# Patient Record
Sex: Female | Born: 1995 | Race: White | Hispanic: No | Marital: Single | Smoking: Never smoker
Health system: Southern US, Community
[De-identification: ages and names within clinical notes are randomized; demographics above are authoritative.]

## PROBLEM LIST (undated history)

## (undated) DIAGNOSIS — N83209 Unspecified ovarian cyst, unspecified side: Secondary | ICD-10-CM

## (undated) DIAGNOSIS — K900 Celiac disease: Secondary | ICD-10-CM

## (undated) DIAGNOSIS — J45909 Unspecified asthma, uncomplicated: Secondary | ICD-10-CM

## (undated) DIAGNOSIS — L309 Dermatitis, unspecified: Secondary | ICD-10-CM

## (undated) HISTORY — DX: Celiac disease: K90.0

## (undated) HISTORY — PX: TONSILLECTOMY: SUR1361

## (undated) HISTORY — PX: ADENOIDECTOMY: SUR15

## (undated) HISTORY — DX: Dermatitis, unspecified: L30.9

## (undated) HISTORY — DX: Unspecified asthma, uncomplicated: J45.909

---

## 2015-05-13 DIAGNOSIS — F419 Anxiety disorder, unspecified: Secondary | ICD-10-CM | POA: Insufficient documentation

## 2015-09-04 DIAGNOSIS — N920 Excessive and frequent menstruation with regular cycle: Secondary | ICD-10-CM | POA: Insufficient documentation

## 2015-09-17 DIAGNOSIS — N83209 Unspecified ovarian cyst, unspecified side: Secondary | ICD-10-CM | POA: Insufficient documentation

## 2016-07-02 DIAGNOSIS — K219 Gastro-esophageal reflux disease without esophagitis: Secondary | ICD-10-CM | POA: Insufficient documentation

## 2016-07-15 ENCOUNTER — Ambulatory Visit (INDEPENDENT_AMBULATORY_CARE_PROVIDER_SITE_OTHER): Payer: Self-pay | Admitting: Orthopaedic Surgery

## 2016-07-20 ENCOUNTER — Encounter (INDEPENDENT_AMBULATORY_CARE_PROVIDER_SITE_OTHER): Payer: Self-pay | Admitting: Orthopaedic Surgery

## 2016-07-20 ENCOUNTER — Ambulatory Visit (INDEPENDENT_AMBULATORY_CARE_PROVIDER_SITE_OTHER): Payer: 59 | Admitting: Orthopaedic Surgery

## 2016-07-20 DIAGNOSIS — M79641 Pain in right hand: Secondary | ICD-10-CM | POA: Diagnosis not present

## 2016-07-20 NOTE — Progress Notes (Signed)
   Office Visit Note   Patient: Jill Vincent           Date of Birth: January 15, 1996           MRN: [id number] Visit Date: 07/20/2016              Requested by: Ilona SorrelAmie Smith Adams, NP [address] [employer] [facility] [place], [place] [id number] PCP: Ilona SorrelADAMS, AMIE SMITH, NP   Assessment & Plan: Visit Diagnoses: No diagnosis found.  Plan: Patient likely has overuse of her interossei muscles with her job. I recommend continue to buddy tape and activity modification. Recommend scheduled meloxicam for 3 weeks and then as needed. If not better should follow up we'll consider advanced imaging.  Follow-Up Instructions: Return if symptoms worsen or fail to improve.   Orders:  No orders of the defined types were placed in this encounter.  No orders of the defined types were placed in this encounter.     Procedures: No procedures performed   Clinical Data: No additional findings.   Subjective: Chief Complaint  Patient presents with  . Right Hand - Pain    Jill Vincent is a 21 yo, RHD, female who presents with a chief complaint of right 4th and 5th digit pain since November. She denies injury. She was seen at Urgent Care in November and XR at that time was negative for fractures. She endorses some numbness and tingling but complains primarily of aching pain. At worst her pain is a 5/10. Patient works as a Pharmacologistpharmacy technician and states that her pain is worse when typing. She has tried meloxicam and voltaren gel with some relief. She took prednisone last week for a URI and did not notice improvement in her hand symptoms. She has also buddy tapes her fingers as a reminder to not use her fingers.     Review of Systems Complete review of systems negative except for history of present illness  Objective: Vital Signs: There were no vitals taken for this visit.  Physical Exam  Constitutional: She is oriented to person, place, and time. She appears well-developed and well-nourished.    HENT:  Head: Normocephalic and atraumatic.  Eyes: EOM are normal.  Neck: Neck supple.  Pulmonary/Chest: Effort normal.  Abdominal: Soft.  Neurological: She is alert and oriented to person, place, and time.  Skin: Skin is warm. Capillary refill takes less than 2 seconds.  Psychiatric: She has a normal mood and affect. Her behavior is normal. Judgment and thought content normal.  Nursing note and vitals reviewed.   Ortho Exam Exam of the right hand shows no swelling or masses or lesions. There is not really tenderness palpation. There is no ulnar nerve symptoms at the elbow or at the wrist. There is no focal motor or sensory deficits. There is no triggering of the fingers. Ulnar side of the wrist is nontender. Specialty Comments:  No specialty comments available.  Imaging: No results found.   PMFS History: There are no active problems to display for this patient.  No past medical history on file.  No family history on file.  No past surgical history on file. Social History   Occupational History  . Not on file.   Social History Main Topics  . Smoking status: Never Smoker  . Smokeless tobacco: Never Used  . Alcohol use Not on file  . Drug use: Unknown  . Sexual activity: Not on file

## 2017-04-28 ENCOUNTER — Ambulatory Visit: Payer: Self-pay | Admitting: Registered"

## 2017-06-24 DIAGNOSIS — K900 Celiac disease: Secondary | ICD-10-CM | POA: Insufficient documentation

## 2017-06-27 HISTORY — PX: UPPER GI ENDOSCOPY: SHX6162

## 2017-08-09 ENCOUNTER — Encounter: Payer: 59 | Attending: Gastroenterology | Admitting: Skilled Nursing Facility1

## 2017-08-09 ENCOUNTER — Encounter: Payer: Self-pay | Admitting: Skilled Nursing Facility1

## 2017-08-09 DIAGNOSIS — K900 Celiac disease: Secondary | ICD-10-CM | POA: Diagnosis not present

## 2017-08-09 DIAGNOSIS — Z713 Dietary counseling and surveillance: Secondary | ICD-10-CM | POA: Diagnosis not present

## 2017-08-09 DIAGNOSIS — Z6835 Body mass index (BMI) 35.0-35.9, adult: Secondary | ICD-10-CM | POA: Diagnosis not present

## 2017-08-09 NOTE — Patient Instructions (Addendum)
-  No Wheat, barley, rye, malt, none at all  -Be Dedra SkeensLeary of: Boullion cubes, brown rice syrup, candy, cold cuts, hot dogs, salami, sausage, communion wafers, Jamaica[place] fries, gravy, imitation fish, licorice, matzo, rice mixes, vegetables in sauce, soy sauce, soups, self-basting Malawiturkey, seasoned snacks, (chips, tortilla chips), sauces

## 2017-08-09 NOTE — Progress Notes (Signed)
  Assessment:  Primary concerns today: referred for celiac disease.  Newly diagnosed as of a month. Pt states she has adhd and if she eats with her medicine it does not affect her appetite. Pt state she is a recovering picky eater. Pt states not eating as much wheat she has less symptoms.   MEDICATIONS: See List    DIETARY INTAKE:  Usual eating pattern includes 2 meals and 2 snacks per day.  Everyday foods include none stated.  Avoided foods include Gluten.    24-hr recall:  B ( AM): skipped Snk ( AM): nuts with dark chocolate chips L ( PM): rice or potato with chicken or beef or pork and salad Snk ( PM): fruit D ( PM): rice or potato with chicken or beef or pork and salad Snk ( PM):  Beverages: water, juice  Usual physical activity: has a gym membership but has not gone  Estimated energy needs: 1600 calories 180 g carbohydrates 120 g protein 44 g fat  Progress Towards Goal(s):  In progress.   Intervention:  Nutrition counseling for celiac diet. Goals: -No Wheat, barley, rye, malt, none at all -Be Dedra SkeensLeary of: Boullion cubes, brown rice syrup, candy, cold cuts, hot dogs, salami, sausage, communion wafers, JamaicaFrench fries, gravy, imitation fish, licorice, matzo, rice mixes, vegetables in sauce, soy sauce, soups, self-basting Malawiturkey, seasoned snacks, (chips, tortilla chips), sauces   Teaching Method Utilized:  Visual Auditory Hands on  Barriers to learning/adherence to lifestyle change: none identified   Demonstrated degree of understanding via:  Teach Back   Monitoring/Evaluation:  Dietary intake, exercise, and body weight prn.

## 2018-01-04 LAB — BASIC METABOLIC PANEL
BUN: 9 (ref 4–21)
CREATININE: 0.9 (ref 0.5–1.1)
GLUCOSE: 72
POTASSIUM: 3.9 (ref 3.4–5.3)
Sodium: 139 (ref 137–147)

## 2018-01-04 LAB — HEPATIC FUNCTION PANEL
ALT: 29 (ref 7–35)
AST: 18 (ref 13–35)

## 2018-01-04 LAB — TSH: TSH: 2.4 (ref 0.41–5.90)

## 2018-01-05 LAB — LIPID PANEL: LDL Cholesterol: 180

## 2018-01-05 LAB — CBC AND DIFFERENTIAL
Hemoglobin: 14 (ref 12.0–16.0)
PLATELETS: 327 (ref 150–399)
WBC: 7

## 2018-01-17 ENCOUNTER — Ambulatory Visit (INDEPENDENT_AMBULATORY_CARE_PROVIDER_SITE_OTHER): Payer: 59 | Admitting: Allergy and Immunology

## 2018-01-17 ENCOUNTER — Encounter: Payer: Self-pay | Admitting: Allergy and Immunology

## 2018-01-17 VITALS — BP 110/80 | HR 116 | Temp 98.3°F | Resp 16 | Ht 64.0 in | Wt 204.8 lb

## 2018-01-17 DIAGNOSIS — K900 Celiac disease: Secondary | ICD-10-CM

## 2018-01-17 DIAGNOSIS — H1010 Acute atopic conjunctivitis, unspecified eye: Secondary | ICD-10-CM

## 2018-01-17 DIAGNOSIS — J3089 Other allergic rhinitis: Secondary | ICD-10-CM

## 2018-01-17 DIAGNOSIS — J4530 Mild persistent asthma, uncomplicated: Secondary | ICD-10-CM

## 2018-01-17 MED ORDER — RANITIDINE HCL 300 MG PO TABS: 300.0000 mg | ORAL_TABLET | Freq: Every day | ORAL | 2 refills | Status: DC

## 2018-01-17 NOTE — Patient Instructions (Addendum)
  1.  Allergen avoidance measures  2.  Consider a lactose-free diet for 4 weeks  3.  Consider a dairy free diet for 4 weeks  4.  Change omeprazole to ranitidine 300 mg 1 time per day for 4 weeks  5.  Continue to treat and prevent inflammation:   A.  Qvar 40 Redihaler -2 inhalations 1-2 times per day  B.  Montelukast 10 mg -1 tablet 1 time per day  6.  If needed:   A.  OTC antihistamine -Claritin/Zyrtec/Allegra  B.  Patanol 1 drop each eye twice a day  C.  Ventolin HFA 2 inhalations every 4-6 hours  D.  OTC moisturizer  7.  Obtain full flu vaccine every year  8.  Return to clinic December 2019 or earlier if problem

## 2018-01-17 NOTE — Progress Notes (Signed)
Dear Laverda Sorenson,  Thank you for referring Jill Vincent to the [facility] on 01/17/2018.   Below is a summation of this patient's evaluation and recommendations.  Thank you for your referral. I will keep you informed about this patient's response to treatment.   If you have any questions please do not hesitate to contact me.   Sincerely,  Jessica Priest, MD Allergy / Immunology [facility]   ______________________________________________________________________    NEW PATIENT NOTE  Referring Provider: Ilona Sorrel, NP Primary Provider: Ilona Sorrel, NP Date of office visit: 01/17/2018    Subjective:   Chief Complaint:  Jill Vincent (DOB: Jan 14, 1996) is a 22 y.o. female who presents to the clinic on 01/17/2018 with a chief complaint of New Patient (Initial Visit) (celiac disease dx Jan 2019 ) .     HPI: Jill Vincent presents to this clinic in evaluation of allergic disease.  She appears to have 4 issues.  First, she was diagnosed with celiac disease January 2019 and with a gluten-free diet most of her diarrhea and a fair amount of her bloating has resolved.  However, she still has intermittent bloating and nausea whenever she eats a meal.  She is interested in investigating whether or not there is a food allergy contributing to this issue.  It should be noted that she also started omeprazole around the point in time in which she was diagnosed with celiac disease and also started a vitamin B supplement around the point in time in which she was diagnosed with celiac disease.  She has tried a rice free diet for 4 weeks and a potato free diet for 4 weeks which did not help her.  She has decreased her dairy consumption 50% but this has not helped her.  Second, she has a history of asthma that presented itself in 2017.  Apparently the trigger for this issue appeared to be secondhand  smoke exposure from a neighbor next-door.  She was started on an inhaled steroid and a leukotriene modifier and she has excellent control at this point in time.  Rarely does she use a short acting bronchodilator and she can exercise without any problem.  She does occasionally get some issues with intermittent shortness of breath for which she will rarely use a short acting bronchodilator during the pollination season of the year.  She only uses Qvar spring through fall but does consistently use a leukotriene modifier through the year.  Third, she has a history of nasal congestion and sneezing and some occasional itchy eyes once again occurring on a perennial basis with flare during the spring and fall for which she will rarely use any Flonase and she does believe that the consistent use of a leukotriene modifier helps this issue.  Fourth, she has a history of eczema that only shows itself at points in time during the wintertime.  She will utilize Eucerin and very rarely will utilize a triamcinolone cream for about 2 weeks during the winter.  This eczema predominantly involves her thighs and arms.  Past Medical History:  Diagnosis Date  . Asthma   . Celiac disease   . Eczema     Past Surgical History:  Procedure Laterality Date  . ADENOIDECTOMY    . TONSILLECTOMY      Allergies as of 01/17/2018      Reactions   Wheat Bran       Medication  List      ALPRAZolam 0.25 MG tablet Commonly known as:  XANAX Take 0.25 mg by mouth daily as needed.   amphetamine-dextroamphetamine 10 MG tablet Commonly known as:  ADDERALL Take 10 mg by mouth.   beclomethasone 40 MCG/ACT inhaler Commonly known as:  QVAR Inhale into the lungs 2 (two) times daily.   busPIRone 15 MG tablet Commonly known as:  BUSPAR TAKE 1/2 TABLET TWICE A DAY FOR 90 DAYS   DULoxetine 60 MG capsule Commonly known as:  CYMBALTA Take by mouth daily.   DULoxetine 30 MG capsule Commonly known as:  CYMBALTA Take by mouth  daily.   LO LOESTRIN FE 1 MG-10 MCG / 10 MCG tablet Generic drug:  Norethindrone-Ethinyl Estradiol-Fe Biphas Take 1 tablet by mouth daily.   meloxicam 15 MG tablet Commonly known as:  MOBIC Take 15 mg by mouth daily.   methylphenidate 36 MG CR tablet Commonly known as:  CONCERTA TAKE 1 TABLET BY MOUTH EVERY DAY IN THE MORNING   montelukast 10 MG tablet Commonly known as:  SINGULAIR TAKE 1 TABLET ONCE A DAY/SEASONALLY   olopatadine 0.1 % ophthalmic solution Commonly known as:  PATANOL INSTILL 1 DROP INTO THE AFFECTED EYE(S) TWICE A DAY AS NEEDED   omeprazole 40 MG capsule Commonly known as:  PRILOSEC Take by mouth daily.   VENTOLIN HFA 108 (90 Base) MCG/ACT inhaler Generic drug:  albuterol USE 2 PUFFS EVERY 4 HOURS AS NEEDED FOR WHEEZING OR SHORTNESS OF BREATH       Review of systems negative except as noted in HPI / PMHx or noted below:  Review of Systems  Constitutional: Negative.   HENT: Negative.   Eyes: Negative.   Respiratory: Negative.   Cardiovascular: Negative.   Gastrointestinal: Negative.   Genitourinary: Negative.   Musculoskeletal: Negative.   Skin: Negative.   Neurological: Negative.   Endo/Heme/Allergies: Negative.   Psychiatric/Behavioral: Negative.     Family History  Problem Relation Age of Onset  . Asthma Other   . Cancer Other   . Diabetes Other   . Hypertension Other   . Stroke Other   . Heart disease Other   . Allergic rhinitis Other   . Eczema Sister   . Urticaria Neg Hx     Social History   Socioeconomic History  . Marital status: Single    Spouse name: Not on file  . Number of children: Not on file  . Years of education: Not on file  . Highest education level: Not on file  Occupational History  . Not on file  Social Needs  . Financial resource strain: Not on file  . Food insecurity:    Worry: Not on file    Inability: Not on file  . Transportation needs:    Medical: Not on file    Non-medical: Not on file    Tobacco Use  . Smoking status: Never Smoker  . Smokeless tobacco: Never Used  Substance and Sexual Activity  . Alcohol use: Not on file  . Drug use: Not on file  . Sexual activity: Not on file  Lifestyle  . Physical activity:    Days per week: Not on file    Minutes per session: Not on file  . Stress: Not on file  Relationships  . Social connections:    Talks on phone: Not on file    Gets together: Not on file    Attends religious service: Not on file    Active member of club or  organization: Not on file    Attends meetings of clubs or organizations: Not on file    Relationship status: Not on file  . Intimate partner violence:    Fear of current or ex partner: Not on file    Emotionally abused: Not on file    Physically abused: Not on file    Forced sexual activity: Not on file  Other Topics Concern  . Not on file  Social History Narrative  . Not on file    Environmental and Social history  Lives in a apartment with a dry environment, a cat and dog located inside the household, carpet in the bedroom, plastic on the bed, no plastic on the pillow, no smokers located inside the household.  She is a Pharmacologist.  Objective:   Vitals:   01/17/18 0810  BP: 110/80  Pulse: (!) 116  Resp: 16  Temp: 98.3 F (36.8 C)   Height: 5\' 4"  (162.6 cm) Weight: 204 lb 12.8 oz (92.9 kg)  Physical Exam  HENT:  Head: Normocephalic. Head is without right periorbital erythema and without left periorbital erythema.  Right Ear: Tympanic membrane, external ear and ear canal normal.  Left Ear: Tympanic membrane, external ear and ear canal normal.  Nose: Nose normal. No mucosal edema or rhinorrhea.  Mouth/Throat: Oropharynx is clear and moist and mucous membranes are normal. No oropharyngeal exudate.  Eyes: Pupils are equal, round, and reactive to light. Conjunctivae and lids are normal.  Neck: Trachea normal. No tracheal deviation present. No thyromegaly present.  Cardiovascular:  Normal rate, regular rhythm, S1 normal, S2 normal and normal heart sounds.  No murmur heard. Pulmonary/Chest: Effort normal. No stridor. No respiratory distress. She has no wheezes. She has no rales. She exhibits no tenderness.  Abdominal: Soft. She exhibits no distension and no mass. There is no hepatosplenomegaly. There is no tenderness. There is no rebound and no guarding.  Musculoskeletal: She exhibits no edema or tenderness.  Lymphadenopathy:       Head (right side): No tonsillar adenopathy present.       Head (left side): No tonsillar adenopathy present.    She has no cervical adenopathy.    She has no axillary adenopathy.  Neurological: She is alert.  Skin: No rash noted. She is not diaphoretic. No erythema. No pallor. Nails show no clubbing.    Diagnostics: Allergy skin tests were performed.  She demonstrated hypersensitivity to grass, trees, weeds, house dust mite, cat.  She did not demonstrate any hypersensitivity against a screening panel of foods.  Spirometry was performed and demonstrated an FEV1 of 3.05 @ 93 % of predicted. FEV1/FVC = 0.92  Assessment and Plan:    1. Asthma, well controlled, mild persistent   2. Perennial allergic rhinitis   3. Seasonal allergic conjunctivitis   4. Adult form of celiac disease     1.  Allergen avoidance measures  2.  Consider a lactose-free diet for 4 weeks  3.  Consider a dairy free diet for 4 weeks  4.  Change omeprazole to ranitidine 300 mg 1 time per day for 4 weeks  5.  Continue to treat and prevent inflammation:   A.  Qvar 40 Redihaler -2 inhalations 1-2 times per day  B.  Montelukast 10 mg -1 tablet 1 time per day  6.  If needed:   A.  OTC antihistamine -Claritin/Zyrtec/Allegra  B.  Patanol 1 drop each eye twice a day  C.  Ventolin HFA 2 inhalations every 4-6 hours  D.  OTC moisturizer  7.  Obtain full flu vaccine every year  8.  Return to clinic December 2019 or earlier if problem  Jill Vincent certainly has atopic  disease and most of this disease state appears to involve her respiratory track and her conjunctiva and occasionally her skin.  I am not sure that atopic disease is responsible for her GI issue.  I did make some suggestions about manipulation she can attempt including using a lactose-free diet, using a dairy free diet, changing her omeprazole to ranitidine to see if this helps her GI issue.  She will keep in contact with me noting her response to the approach noted above.  I will see her back in this clinic in December 2019 or earlier if there is a problem.  Jessica PriestEric J. Toan Mort, MD Allergy / Immunology [facility] [place]

## 2018-01-18 ENCOUNTER — Encounter: Payer: Self-pay | Admitting: Allergy and Immunology

## 2018-03-07 ENCOUNTER — Ambulatory Visit (HOSPITAL_COMMUNITY)
Admission: EM | Admit: 2018-03-07 | Discharge: 2018-03-07 | Disposition: A | Discharge status: Home / Self Care | Payer: 59 | Attending: Family Medicine | Admitting: Family Medicine

## 2018-03-07 ENCOUNTER — Encounter (HOSPITAL_COMMUNITY): Payer: Self-pay

## 2018-03-07 ENCOUNTER — Other Ambulatory Visit: Payer: Self-pay

## 2018-03-07 DIAGNOSIS — G43109 Migraine with aura, not intractable, without status migrainosus: Secondary | ICD-10-CM | POA: Diagnosis not present

## 2018-03-07 MED ORDER — KETOROLAC TROMETHAMINE 60 MG/2ML IM SOLN
60.0000 mg | Freq: Once | INTRAMUSCULAR | Status: AC
  Administered 2018-03-07: 60 mg via INTRAMUSCULAR

## 2018-03-07 MED ORDER — KETOROLAC TROMETHAMINE 60 MG/2ML IM SOLN
INTRAMUSCULAR | Status: AC
  Filled 2018-03-07: qty 2

## 2018-03-07 MED ORDER — ONDANSETRON 4 MG PO TBDP
4.0000 mg | ORAL_TABLET | Freq: Once | ORAL | Status: AC
  Administered 2018-03-07: 4 mg via ORAL

## 2018-03-07 MED ORDER — ONDANSETRON 4 MG PO TBDP
ORAL_TABLET | ORAL | Status: AC
  Filled 2018-03-07: qty 1

## 2018-03-07 MED ORDER — ONDANSETRON HCL 8 MG PO TABS: 8.0000 mg | ORAL_TABLET | Freq: Three times a day (TID) | ORAL | 3 refills | Status: DC | PRN

## 2018-03-07 NOTE — ED Provider Notes (Signed)
[facility]    CSN: [id number] Arrival date & time: 03/07/18  [id number]     History   Chief Complaint Chief Complaint  Patient presents with  . Migraine    HPI Jenell MillinerMargaret Walkup is a 22 y.o. female.   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  The onset of this was 4 AM this morning when patient woke up with left retro-orbital pain.  She is taken to rizatriptan tablets so far and has seen no improvement.  Headache is associated with photophobia and nausea but no vomiting.  Significant negatives: No fever, head trauma, dizziness, ear pain, difficulty swallowing or moving  Past medical history: Patient has had migraine headaches for over 10 years.  Over the past year she has been having worsening headaches, now 3-4 times a month.  Generally if she takes the rizatriptan early, the headache will be aborted.  Nevertheless she is missing at least one day a month from the headaches.  These are usually accompanied by nausea, and less frequently, photophobia.  Patient has not tried Topamax or other preventive medicines.  Patient has a appointment with her primary care doctor in October.     Past Medical History:  Diagnosis Date  . Asthma   . Celiac disease   . Eczema     There are no active problems to display for this patient.   Past Surgical History:  Procedure Laterality Date  . ADENOIDECTOMY    . TONSILLECTOMY      OB History   None      Home Medications    Prior to Admission medications   Medication Sig Start Date End Date Taking? Authorizing Provider  ALPRAZolam (XANAX) 0.25 MG tablet Take 0.25 mg by mouth daily as needed. 11/10/17   [provider]  amphetamine-dextroamphetamine (ADDERALL) 10 MG tablet Take 10 mg by mouth. 05/11/16   [provider]  beclomethasone (QVAR) 40 MCG/ACT inhaler Inhale into the lungs 2 (two) times daily.    [provider]  busPIRone (BUSPAR) 15 MG tablet TAKE 1/2 TABLET TWICE A DAY  FOR 90 DAYS 01/04/18   [provider]  DULoxetine (CYMBALTA) 30 MG capsule Take by mouth daily. 12/07/17   [provider]  DULoxetine (CYMBALTA) 60 MG capsule Take by mouth daily. 11/04/17   [provider]  LO LOESTRIN FE 1 MG-10 MCG / 10 MCG tablet Take 1 tablet by mouth daily. 01/07/18   [provider]  meloxicam (MOBIC) 15 MG tablet Take 15 mg by mouth daily. 06/12/16   [provider]  methylphenidate 36 MG PO CR tablet TAKE 1 TABLET BY MOUTH EVERY DAY IN THE MORNING 10/12/17   [provider]  montelukast (SINGULAIR) 10 MG tablet TAKE 1 TABLET ONCE A DAY/SEASONALLY 12/12/17   [provider]  olopatadine (PATANOL) 0.1 % ophthalmic solution INSTILL 1 DROP INTO THE AFFECTED EYE(S) TWICE A DAY AS NEEDED 01/02/18   [provider]  omeprazole (PRILOSEC) 40 MG capsule Take by mouth daily. 10/18/17   [provider]  ondansetron (ZOFRAN) 8 MG tablet Take 1 tablet (8 mg total) by mouth every 8 (eight) hours as needed for nausea or vomiting. 03/07/18   Elvina SidleLauenstein, Azahel Belcastro, MD  ranitidine (ZANTAC) 300 MG tablet Take 1 tablet (300 mg total) by mouth at bedtime. 01/17/18   Kozlow, Alvira PhilipsEric J, MD  VENTOLIN HFA 108 (90 Base) MCG/ACT inhaler USE 2 PUFFS EVERY 4 HOURS AS NEEDED FOR WHEEZING OR SHORTNESS OF BREATH 07/12/16  [provider]    Family History Family History  Problem Relation Age of Onset  . Asthma Other   . Cancer Other   . Diabetes Other   . Hypertension Other   . Stroke Other   . Heart disease Other   . Allergic rhinitis Other   . Eczema Sister   . Urticaria Neg Hx     Social History Social History   Tobacco Use  . Smoking status: Never Smoker  . Smokeless tobacco: Never Used  Substance Use Topics  . Alcohol use: Not on file  . Drug use: Not on file     Allergies   Wheat bran   Review of Systems Review of Systems  Eyes: Positive for photophobia. Negative for visual disturbance.    Gastrointestinal: Positive for nausea. Negative for vomiting.  Neurological: Positive for headaches.  All other systems reviewed and are negative.    Physical Exam Triage Vital Signs ED Triage Vitals  Enc Vitals Group     BP 03/07/18 0941 128/87     Pulse Rate 03/07/18 0941 (!) 118     Resp 03/07/18 0941 16     Temp 03/07/18 0941 98.7 F (37.1 C)     Temp Source 03/07/18 0941 Oral     SpO2 03/07/18 0941 100 %     Weight 03/07/18 0944 200 lb (90.7 kg)     Height --      Head Circumference --      Peak Flow --      Pain Score --      Pain Loc --      Pain Edu? --      Excl. in GC? --    No data found.  Updated Vital Signs BP 128/87 (BP Location: Right Arm)   Pulse (!) 118 Comment: Pt sts that this is normal for her due to meds that she is taking for ADD  Temp 98.7 F (37.1 C) (Oral)   Resp 16   Wt 90.7 kg   SpO2 100%   BMI 34.33 kg/m   Visual Acuity Right Eye Distance:   Left Eye Distance:   Bilateral Distance:    Right Eye Near:   Left Eye Near:    Bilateral Near:     Physical Exam  Constitutional: She is oriented to person, place, and time. She appears well-developed and well-nourished.  HENT:  Right Ear: External ear normal.  Left Ear: External ear normal.  Mouth/Throat: Oropharynx is clear and moist.  Eyes: Pupils are equal, round, and reactive to light. Conjunctivae and EOM are normal.  Fundi are normal  Neck: Normal range of motion. Neck supple.  Cardiovascular: Normal rate, regular rhythm and normal heart sounds.  Pulmonary/Chest: Effort normal and breath sounds normal.  Musculoskeletal: Normal range of motion.  Neurological: She is alert and oriented to person, place, and time. No cranial nerve deficit. She exhibits normal muscle tone. Coordination normal.  Skin: Skin is warm and dry.  Psychiatric: She has a normal mood and affect. Her behavior is normal. Judgment and thought content normal.  Nursing note and vitals reviewed.    UC Treatments  / Results  Labs (all labs ordered are listed, but only abnormal results are displayed) Labs Reviewed - No data to display  EKG None  Radiology No results found.  Procedures Procedures (including critical care time)  Medications Ordered in UC Medications  ondansetron (ZOFRAN-ODT) disintegrating tablet 4 mg (has no administration in time range)  ketorolac (TORADOL) injection 60  mg (has no administration in time range)    Initial Impression / Assessment and Plan / UC Course  I have reviewed the triage vital signs and the nursing notes.  Pertinent labs & imaging results that were available during my care of the patient were reviewed by me and considered in my medical decision making (see chart for details).    Final Clinical Impressions(s) / UC Diagnoses   Final diagnoses:  Migraine with aura and without status migrainosus, not intractable     Discharge Instructions     Discuss preventive medicines with your primary care doctor at your next visit: Topamax and Aimovig    ED Prescriptions    Medication Sig Dispense Auth. Provider   ondansetron (ZOFRAN) 8 MG tablet Take 1 tablet (8 mg total) by mouth every 8 (eight) hours as needed for nausea or vomiting. 20 tablet Elvina Sidle, MD     Controlled Substance Prescriptions [place] Controlled Substance Registry consulted? Not Applicable   Elvina Sidle, MD 03/07/18 1019

## 2018-03-07 NOTE — ED Triage Notes (Signed)
Pt states she has a migraine and she has taken all of her med's. The mirgraine is getting worst. This started today.

## 2018-03-07 NOTE — Discharge Instructions (Addendum)
Discuss preventive medicines with your primary care doctor at your next visit: Topamax and Aimovig

## 2018-05-01 ENCOUNTER — Other Ambulatory Visit: Payer: Self-pay | Admitting: *Deleted

## 2018-05-01 ENCOUNTER — Telehealth: Payer: Self-pay | Admitting: Allergy and Immunology

## 2018-05-01 MED ORDER — MONTELUKAST SODIUM 10 MG PO TABS: ORAL_TABLET | ORAL | 5 refills | Status: DC

## 2018-05-01 NOTE — Telephone Encounter (Signed)
Pt called and needs to have singulair called into [facility] [address]. [phone]

## 2018-05-01 NOTE — Telephone Encounter (Signed)
Prescription has been sent in. Called patient and informed them of prescription being sent in. Patient verbalized understanding.

## 2018-05-16 ENCOUNTER — Ambulatory Visit (INDEPENDENT_AMBULATORY_CARE_PROVIDER_SITE_OTHER): Payer: 59 | Admitting: Allergy and Immunology

## 2018-05-16 ENCOUNTER — Encounter: Payer: Self-pay | Admitting: Allergy and Immunology

## 2018-05-16 VITALS — BP 126/88 | HR 96 | Resp 20

## 2018-05-16 DIAGNOSIS — J3089 Other allergic rhinitis: Secondary | ICD-10-CM | POA: Diagnosis not present

## 2018-05-16 DIAGNOSIS — H1010 Acute atopic conjunctivitis, unspecified eye: Secondary | ICD-10-CM

## 2018-05-16 DIAGNOSIS — J4530 Mild persistent asthma, uncomplicated: Secondary | ICD-10-CM | POA: Diagnosis not present

## 2018-05-16 NOTE — Progress Notes (Signed)
Follow-up Note  Referring Provider: Ilona Vincent, Jill Smith, NP Primary Provider: Sigmund Vincent, Lisa, MD Date of Office Visit: 05/16/2018  Subjective:   Jill Vincent (DOB: [birth year]) is a 22 y.o. female who [facility] in re-evaluation of the following:  HPI: Jill Vincent returns to this clinic in reevaluation of her atopic disease.  I last saw her in his clinic during initial evaluation of 17 January 2018.  Concerning her asthma and allergic rhinitis this is actually under very good control with the use of montelukast as her sole controller agent at this point of the year.  She was able to taper off her Qvar after the pollen season abated at the beginning of November.  She rarely uses a short acting bronchodilator and can exert herself without any problem.  She did have a gastrointestinal issue when she came to see me which is being followed by her gastroenterologist and she is now using Nexium to treat reflux and overall is doing better as long as she remains away from grain consumption other than corn and rice.  She did obtain the flu vaccine.  Allergies as of 05/16/2018      Reactions   Wheat Bran       Medication List      ALPRAZolam 0.25 MG tablet Commonly known as:  XANAX Take 0.25 mg by mouth daily as needed.   amphetamine-dextroamphetamine 10 MG tablet Commonly known as:  ADDERALL Take 10 mg by mouth.   beclomethasone 40 MCG/ACT inhaler Commonly known as:  QVAR Inhale into the lungs 2 (two) times daily.   busPIRone 15 MG tablet Commonly known as:  BUSPAR TAKE 1/2 TABLET TWICE A DAY FOR 90 DAYS   DULoxetine 60 MG capsule Commonly known as:  CYMBALTA Take by mouth daily.   DULoxetine 30 MG capsule Commonly known as:  CYMBALTA Take by mouth daily.   LO LOESTRIN FE 1 MG-10 MCG / 10 MCG tablet Generic drug:  Norethindrone-Ethinyl Estradiol-Fe Biphas Take 1 tablet by mouth daily.   meloxicam 15 MG tablet Commonly known as:   MOBIC Take 15 mg by mouth daily.   methylphenidate 36 MG CR tablet Commonly known as:  CONCERTA TAKE 1 TABLET BY MOUTH EVERY DAY IN THE MORNING   montelukast 10 MG tablet Commonly known as:  SINGULAIR TAKE 1 TABLET ONCE A DAY/SEASONALLY   olopatadine 0.1 % ophthalmic solution Commonly known as:  PATANOL INSTILL 1 DROP INTO THE AFFECTED EYE(S) TWICE A DAY AS NEEDED   omeprazole 40 MG capsule Commonly known as:  PRILOSEC Take by mouth daily.   ondansetron 8 MG tablet Commonly known as:  ZOFRAN Take 1 tablet (8 mg total) by mouth every 8 (eight) hours as needed for nausea or vomiting.   ranitidine 300 MG tablet Commonly known as:  ZANTAC Take 1 tablet (300 mg total) by mouth at bedtime.   VENTOLIN HFA 108 (90 Base) MCG/ACT inhaler Generic drug:  albuterol USE 2 PUFFS EVERY 4 HOURS AS NEEDED FOR WHEEZING OR SHORTNESS OF BREATH       Past Medical History:  Diagnosis Date  . Asthma   . Celiac disease   . Eczema     Past Surgical History:  Procedure Laterality Date  . ADENOIDECTOMY    . TONSILLECTOMY      Review of systems negative except as noted in HPI / PMHx or noted below:  Review of Systems  Constitutional: Negative.   HENT: Negative.   Eyes:  Negative.   Respiratory: Negative.   Cardiovascular: Negative.   Gastrointestinal: Negative.   Genitourinary: Negative.   Musculoskeletal: Negative.   Skin: Negative.   Neurological: Negative.   Endo/Heme/Allergies: Negative.   Psychiatric/Behavioral: Negative.      Objective:   Vitals:   05/16/18 1142  BP: 126/88  Pulse: 96  Resp: 20          Physical Exam  HENT:  Head: Normocephalic.  Right Ear: Tympanic membrane, external ear and ear canal normal.  Left Ear: Tympanic membrane, external ear and ear canal normal.  Nose: Nose normal. No mucosal edema or rhinorrhea.  Mouth/Throat: Uvula is midline, oropharynx is clear and moist and mucous membranes are normal. No oropharyngeal exudate.  Eyes:  Conjunctivae are normal.  Neck: Trachea normal. No tracheal tenderness present. No tracheal deviation present. No thyromegaly present.  Cardiovascular: Normal rate, regular rhythm, S1 normal, S2 normal and normal heart sounds.  No murmur heard. Pulmonary/Chest: Breath sounds normal. No stridor. No respiratory distress. She has no wheezes. She has no rales.  Musculoskeletal: She exhibits no edema.  Lymphadenopathy:       Head (right side): No tonsillar adenopathy present.       Head (left side): No tonsillar adenopathy present.    She has no cervical adenopathy.  Neurological: She is alert.  Skin: No rash noted. She is not diaphoretic. No erythema. Nails show no clubbing.    Diagnostics:    Spirometry was performed and demonstrated an FEV1 of 3.01 at 79 % of predicted.  The patient had an Asthma Control Test with the following results: ACT Total Score: 21.    Assessment and Plan:   1. Asthma, well controlled, mild persistent   2. Perennial allergic rhinitis   3. Seasonal allergic conjunctivitis     1.  Allergen avoidance measures  2. Continue to treat and prevent inflammation:   A.  Qvar 40 Redihaler -2 inhalations 1-2 times per day  B.  Montelukast 10 mg -1 tablet 1 time per day  3.  If needed:   A.  OTC antihistamine -Claritin/Zyrtec/Allegra  B.  Patanol 1 drop each eye twice a day  C.  Ventolin HFA 2 inhalations every 4-6 hours  D.  OTC moisturizer  4.  "Action plan" for asthma flareup:   A.  Increase Qvar to 3 inhalations 3 times a day  B.  Use Ventolin HFA if needed  5.  Return to clinic Summer 2020 or earlier if problem  Overall Jill Vincent has shown improvement with her current therapy and I think it would be worthwhile for her to remain on montelukast on a consistent basis.  Currently she is not using her Qvar but I have encouraged her to restart her Qvar Valentine's Day 2020 in anticipation of springtime pollen exposure.  I will see her back in this clinic in the  summer 2020 or earlier if there is a problem.  Jill Schimke, MD Allergy / Immunology [facility]

## 2018-05-16 NOTE — Patient Instructions (Addendum)
  1.  Allergen avoidance measures  2. Continue to treat and prevent inflammation:   A.  Qvar 40 Redihaler -2 inhalations 1-2 times per day  B.  Montelukast 10 mg -1 tablet 1 time per day  3.  If needed:   A.  OTC antihistamine -Claritin/Zyrtec/Allegra  B.  Patanol 1 drop each eye twice a day  C.  Ventolin HFA 2 inhalations every 4-6 hours  D.  OTC moisturizer  4.  "Action plan" for asthma flareup:   A.  Increase Qvar to 3 inhalations 3 times a day  B.  Use Ventolin HFA if needed  5.  Return to clinic Summer 2020 or earlier if problem

## 2018-05-17 ENCOUNTER — Encounter: Payer: Self-pay | Admitting: Allergy and Immunology

## 2018-05-18 ENCOUNTER — Ambulatory Visit (INDEPENDENT_AMBULATORY_CARE_PROVIDER_SITE_OTHER): Payer: 59 | Admitting: Family Medicine

## 2018-05-18 ENCOUNTER — Encounter: Payer: Self-pay | Admitting: Family Medicine

## 2018-05-18 ENCOUNTER — Encounter: Payer: Self-pay | Admitting: Emergency Medicine

## 2018-05-18 VITALS — BP 102/70 | HR 121 | Temp 97.8°F | Wt 203.8 lb

## 2018-05-18 DIAGNOSIS — F419 Anxiety disorder, unspecified: Secondary | ICD-10-CM

## 2018-05-18 DIAGNOSIS — J4540 Moderate persistent asthma, uncomplicated: Secondary | ICD-10-CM | POA: Diagnosis not present

## 2018-05-18 DIAGNOSIS — F900 Attention-deficit hyperactivity disorder, predominantly inattentive type: Secondary | ICD-10-CM | POA: Diagnosis not present

## 2018-05-18 MED ORDER — METHYLPHENIDATE HCL ER (OSM) 36 MG PO TBCR: EXTENDED_RELEASE_TABLET | ORAL | 0 refills | Status: DC

## 2018-05-18 MED ORDER — BUSPIRONE HCL 15 MG PO TABS: ORAL_TABLET | ORAL | 2 refills | Status: DC

## 2018-05-18 NOTE — Patient Instructions (Signed)
Great to meet you! Continue current medications, Buspar sent in to replace alprazolam.  See you again in 3 months.

## 2018-05-18 NOTE — Assessment & Plan Note (Signed)
-  Stable with current medications continue.

## 2018-05-18 NOTE — Assessment & Plan Note (Signed)
-  Doing well with cymbalta, continue -Restart buspar in place of alprazolam -Referral for counseling.

## 2018-05-18 NOTE — Assessment & Plan Note (Signed)
-  Doing well with current medications -Will continue concerta at current dose. Still has adderall at home as she only uses when she has evening class -Controlled medication agreement signed

## 2018-05-18 NOTE — Progress Notes (Signed)
Jill Vincent - 22 y.o. female MRN [id number]  Date of birth: 1996/03/21  Subjective Chief Complaint  Patient presents with  . Medication Refill    HPI Jill Vincent is a 22 y.o. female with history of Asthma, celiac disease, anxiety and ADHD here today to establish care with new pcp and refill of medications.   -ADHD:  History of ADHD diagnosed 5-6 years ago.  Current treatment with Concerta 36mg  daily with 10mg  adderall IR in the afternoon/evening as needed on days she has classes.  She reports that adderall makes her feels a little jittery/anxious but she only uses 1-2 nights per week.  She denies side effects from Concerta including increased anxiety, palpitations, or insomnia.  Weights have been stable.  Previous records reviewed as available through care everywhere.  [place] PMP reviewed and shows consistent fill of medication.   -Anxiety:  Current treatment with cymbalta 90mg  daily.  She has been prescribed alprazolam as well as needed.  Typically only takes this when going back home to [place] as this is stressful for her.  She prefers buspar over alprazolam but this had been on backorder.  She would like to get back on the buspar at this point.    -Asthma:  Has bene fairly well controlled with current inhalers and singulair.  Denies increased wheezing, cough or shortness of breath.   ROS:  A comprehensive ROS was completed and negative except as noted per HPI  Allergies  Allergen Reactions  . Wheat Bran     Past Medical History:  Diagnosis Date  . Asthma   . Celiac disease   . Eczema     Past Surgical History:  Procedure Laterality Date  . ADENOIDECTOMY    . TONSILLECTOMY      Social History   Socioeconomic History  . Marital status: Single    Spouse name: Not on file  . Number of children: Not on file  . Years of education: Not on file  . Highest education level: Not on file  Occupational History  . Not on file  Social Needs  . Financial resource strain: Not  on file  . Food insecurity:    Worry: Not on file    Inability: Not on file  . Transportation needs:    Medical: Not on file    Non-medical: Not on file  Tobacco Use  . Smoking status: Never Smoker  . Smokeless tobacco: Never Used  Substance and Sexual Activity  . Alcohol use: Yes    Comment: social   . Drug use: Never  . Sexual activity: Not on file  Lifestyle  . Physical activity:    Days per week: Not on file    Minutes per session: Not on file  . Stress: Not on file  Relationships  . Social connections:    Talks on phone: Not on file    Gets together: Not on file    Attends religious service: Not on file    Active member of club or organization: Not on file    Attends meetings of clubs or organizations: Not on file    Relationship status: Not on file  Other Topics Concern  . Not on file  Social History Narrative  . Not on file    Family History  Problem Relation Age of Onset  . Allergic rhinitis Other   . Eczema Sister   . Hyperthyroidism Sister   . Graves' disease Sister   . Hypertension Father   . Diabetes Paternal Uncle   .  Hypertension Maternal Grandmother   . Cancer Maternal Grandmother   . Kidney cancer Maternal Grandmother   . Breast cancer Maternal Grandmother   . Heart disease Maternal Grandfather   . Stroke Maternal Grandfather   . Cancer Maternal Grandfather   . Lung cancer Maternal Grandfather   . Cancer Paternal Grandmother   . Lung cancer Paternal Grandmother   . Cancer Paternal Grandfather   . Lung cancer Paternal Grandfather   . Urticaria Neg Hx     Health Maintenance  Topic Date Due  . TETANUS/TDAP  12/09/2012  . PAP SMEAR  12/10/2014  . HIV Screening  05/19/2019 (Originally 12/09/2008)  . INFLUENZA VACCINE  Completed     ----------------------------------------------------------------------------------------------------------------------------------------------------------------------------------------------------------------- Physical Exam BP 102/70   Pulse (!) 121   Temp 97.8 F (36.6 C) (Oral)   Wt 203 lb 12.8 oz (92.4 kg)   SpO2 98%   BMI 34.98 kg/m   Physical Exam  Constitutional: She is oriented to person, place, and time. She appears well-nourished. No distress.  HENT:  Head: Normocephalic and atraumatic.  Mouth/Throat: Oropharynx is clear and moist.  Eyes: No scleral icterus.  Neck: Neck supple. No thyromegaly present.  Cardiovascular: Normal rate, regular rhythm and normal heart sounds.  Pulmonary/Chest: Effort normal and breath sounds normal.  Lymphadenopathy:    She has no cervical adenopathy.  Neurological: She is alert and oriented to person, place, and time.  Skin: Skin is warm and dry.  Psychiatric: She has a normal mood and affect. Her behavior is normal.    ------------------------------------------------------------------------------------------------------------------------------------------------------------------------------------------------------------------- Assessment and Plan  Moderate persistent asthma without complication -Stable with current medications continue.   Attention deficit hyperactivity disorder (ADHD), predominantly inattentive type -Doing well with current medications -Will continue concerta at current dose. Still has adderall at home as she only uses when she has evening class -Controlled medication agreement signed   Anxiety -Doing well with cymbalta, continue -Restart buspar in place of alprazolam -Referral for counseling.

## 2018-05-31 ENCOUNTER — Encounter: Payer: Self-pay | Admitting: Family Medicine

## 2018-06-15 ENCOUNTER — Ambulatory Visit (INDEPENDENT_AMBULATORY_CARE_PROVIDER_SITE_OTHER): Payer: 59

## 2018-06-15 ENCOUNTER — Ambulatory Visit (INDEPENDENT_AMBULATORY_CARE_PROVIDER_SITE_OTHER): Payer: 59 | Admitting: Family Medicine

## 2018-06-15 ENCOUNTER — Encounter: Payer: Self-pay | Admitting: Family Medicine

## 2018-06-15 VITALS — BP 132/68 | HR 98 | Temp 98.5°F | Ht 64.0 in | Wt 206.0 lb

## 2018-06-15 DIAGNOSIS — R109 Unspecified abdominal pain: Secondary | ICD-10-CM | POA: Diagnosis not present

## 2018-06-15 DIAGNOSIS — R300 Dysuria: Secondary | ICD-10-CM

## 2018-06-15 LAB — POC URINALSYSI DIPSTICK (AUTOMATED)
Bilirubin, UA: NEGATIVE
Blood, UA: NEGATIVE
Glucose, UA: NEGATIVE
Ketones, UA: NEGATIVE
Leukocytes, UA: NEGATIVE
Nitrite, UA: NEGATIVE
PROTEIN UA: NEGATIVE
SPEC GRAV UA: 1.01 (ref 1.010–1.025)
Urobilinogen, UA: 0.2 E.U./dL
pH, UA: 7.5 (ref 5.0–8.0)

## 2018-06-15 MED ORDER — METHYLPHENIDATE HCL ER (OSM) 36 MG PO TBCR: EXTENDED_RELEASE_TABLET | ORAL | 0 refills | Status: DC

## 2018-06-15 MED ORDER — HYDROCODONE-ACETAMINOPHEN 5-325 MG PO TABS: 1.0000 | ORAL_TABLET | Freq: Four times a day (QID) | ORAL | 0 refills | Status: DC | PRN

## 2018-06-15 NOTE — Assessment & Plan Note (Signed)
-  Differential includes stone vs infection vs muscle strain/spasm.  I think infection such as pyelonephritis is less likely with normal UA.  -KUB ordered, may need to [place] or CT scan if inconclusive -Check CBC -UA sent for culture.  -Norco 5/325mg  #15 for pain control.

## 2018-06-15 NOTE — Progress Notes (Signed)
Jill MillinerMargaret Hornaday - 23 y.o. female MRN [id number]  Date of birth: [birth year]  Subjective Chief Complaint  Patient presents with  . Dysuria    ongoing for one day-increased pain and urination    HPI Jill MillinerMargaret Vincent is a 23 y.o. female here today with complaint of L flank pain.  She reports that symptoms began about 2 days ago.  She does feel like she has had some pain with urination on occasion as well as mild nausea.  She may have had  Low grade fever.  She denies blood in her urine, foul odor, vaginal discharge, abdominal pain, shortness of breath.  She has some left over norco at home and used 1/2 tab of 5/325mg  with some relief but is still pretty uncomfortable.  She has no history of kidney stones.   ROS:  A comprehensive ROS was completed and negative except as noted per HPI  Allergies  Allergen Reactions  . Wheat Bran     Past Medical History:  Diagnosis Date  . Asthma   . Celiac disease   . Eczema     Past Surgical History:  Procedure Laterality Date  . ADENOIDECTOMY    . TONSILLECTOMY    . UPPER GI ENDOSCOPY  06/27/2017    Social History   Socioeconomic History  . Marital status: Single    Spouse name: Not on file  . Number of children: Not on file  . Years of education: Not on file  . Highest education level: Not on file  Occupational History  . Not on file  Social Needs  . Financial resource strain: Not on file  . Food insecurity:    Worry: Not on file    Inability: Not on file  . Transportation needs:    Medical: Not on file    Non-medical: Not on file  Tobacco Use  . Smoking status: Never Smoker  . Smokeless tobacco: Never Used  Substance and Sexual Activity  . Alcohol use: Yes    Comment: social   . Drug use: Never  . Sexual activity: Not on file  Lifestyle  . Physical activity:    Days per week: Not on file    Minutes per session: Not on file  . Stress: Not on file  Relationships  . Social connections:    Talks on phone: Not on file   Gets together: Not on file    Attends religious service: Not on file    Active member of club or organization: Not on file    Attends meetings of clubs or organizations: Not on file    Relationship status: Not on file  Other Topics Concern  . Not on file  Social History Narrative  . Not on file    Family History  Problem Relation Age of Onset  . Allergic rhinitis Other   . Eczema Sister   . Hyperthyroidism Sister   . Graves' disease Sister   . Hypertension Father   . Diabetes Paternal Uncle   . Hypertension Maternal Grandmother   . Cancer Maternal Grandmother   . Kidney cancer Maternal Grandmother   . Breast cancer Maternal Grandmother   . Heart disease Maternal Grandfather   . Stroke Maternal Grandfather   . Cancer Maternal Grandfather   . Lung cancer Maternal Grandfather   . Cancer Paternal Grandmother   . Lung cancer Paternal Grandmother   . Cancer Paternal Grandfather   . Lung cancer Paternal Grandfather   . Urticaria Neg Hx     Health  Maintenance  Topic Date Due  . PAP SMEAR-Modifier  12/10/2014  . HIV Screening  05/19/2019 (Originally 12/09/2008)  . PAP-Cervical Cytology Screening  01/04/2021  . TETANUS/TDAP  02/09/2027  . INFLUENZA VACCINE  Completed    ----------------------------------------------------------------------------------------------------------------------------------------------------------------------------------------------------------------- Physical Exam BP 132/68   Pulse 98   Temp 98.5 F (36.9 C) (Oral)   Ht 5\' 4"  (1.626 m)   Wt 206 lb (93.4 kg)   SpO2 100%   BMI 35.36 kg/m   Physical Exam HENT:     Head: Normocephalic and atraumatic.     Mouth/Throat:     Mouth: Mucous membranes are moist.  Eyes:     General: No scleral icterus. Cardiovascular:     Rate and Rhythm: Normal rate and regular rhythm.  Pulmonary:     Effort: Pulmonary effort is normal.     Breath sounds: Normal breath sounds.  Abdominal:     General: There is  no distension.     Palpations: Abdomen is soft.     Tenderness: There is no abdominal tenderness. There is left CVA tenderness. There is no right CVA tenderness.  Skin:    General: Skin is warm and dry.  Neurological:     General: No focal deficit present.     Mental Status: She is alert.  Psychiatric:        Mood and Affect: Mood normal.        Behavior: Behavior normal.     ------------------------------------------------------------------------------------------------------------------------------------------------------------------------------------------------------------------- Assessment and Plan  No problem-specific Assessment & Plan notes found for this encounter.

## 2018-06-15 NOTE — Patient Instructions (Addendum)
I'll be in touch with lab and xray results.  Use norco as directed for pain control.

## 2018-06-15 NOTE — Addendum Note (Signed)
Addended by: Mammie Lorenzo on: 06/15/2018 04:32 PM   Modules accepted: Orders

## 2018-06-16 LAB — CBC
HCT: 42.5 % (ref 36.0–46.0)
HEMOGLOBIN: 14.1 g/dL (ref 12.0–15.0)
MCHC: 33.2 g/dL (ref 30.0–36.0)
MCV: 89.3 fl (ref 78.0–100.0)
PLATELETS: 294 10*3/uL (ref 150.0–400.0)
RBC: 4.76 Mil/uL (ref 3.87–5.11)
RDW: 13.1 % (ref 11.5–15.5)
WBC: 4.8 10*3/uL (ref 4.0–10.5)

## 2018-06-16 LAB — URINE CULTURE
MICRO NUMBER:: 3042
RESULT: NO GROWTH
SPECIMEN QUALITY:: ADEQUATE

## 2018-06-19 ENCOUNTER — Encounter: Payer: Self-pay | Admitting: Family Medicine

## 2018-06-19 ENCOUNTER — Inpatient Hospital Stay: Admission: RE | Admit: 2018-06-19 | Payer: Self-pay | Source: Ambulatory Visit

## 2018-07-05 ENCOUNTER — Ambulatory Visit (INDEPENDENT_AMBULATORY_CARE_PROVIDER_SITE_OTHER): Payer: 59 | Admitting: Psychology

## 2018-07-05 DIAGNOSIS — F411 Generalized anxiety disorder: Secondary | ICD-10-CM | POA: Diagnosis not present

## 2018-07-05 DIAGNOSIS — F331 Major depressive disorder, recurrent, moderate: Secondary | ICD-10-CM | POA: Diagnosis not present

## 2018-07-16 ENCOUNTER — Other Ambulatory Visit: Payer: Self-pay | Admitting: Family Medicine

## 2018-07-26 ENCOUNTER — Ambulatory Visit (INDEPENDENT_AMBULATORY_CARE_PROVIDER_SITE_OTHER): Payer: 59 | Admitting: Psychology

## 2018-07-26 DIAGNOSIS — F331 Major depressive disorder, recurrent, moderate: Secondary | ICD-10-CM

## 2018-07-26 DIAGNOSIS — F411 Generalized anxiety disorder: Secondary | ICD-10-CM | POA: Diagnosis not present

## 2018-07-28 ENCOUNTER — Encounter: Payer: Self-pay | Admitting: Family Medicine

## 2018-08-02 ENCOUNTER — Ambulatory Visit: Payer: 59 | Admitting: Family Medicine

## 2018-08-03 ENCOUNTER — Ambulatory Visit (INDEPENDENT_AMBULATORY_CARE_PROVIDER_SITE_OTHER): Payer: 59 | Admitting: Family Medicine

## 2018-08-03 ENCOUNTER — Encounter: Payer: Self-pay | Admitting: Family Medicine

## 2018-08-03 VITALS — BP 130/68 | HR 88 | Temp 97.9°F | Ht 64.0 in | Wt 204.0 lb

## 2018-08-03 DIAGNOSIS — Z79899 Other long term (current) drug therapy: Secondary | ICD-10-CM

## 2018-08-03 DIAGNOSIS — F900 Attention-deficit hyperactivity disorder, predominantly inattentive type: Secondary | ICD-10-CM | POA: Diagnosis not present

## 2018-08-03 MED ORDER — METHYLPHENIDATE HCL ER (OSM) 36 MG PO TBCR: EXTENDED_RELEASE_TABLET | ORAL | 0 refills | Status: DC

## 2018-08-03 NOTE — Progress Notes (Signed)
Jill Vincent - 23 y.o. female MRN [id number]  Date of birth: Jan 01, 1996  Subjective Chief Complaint  Patient presents with  . Follow-up    denies health changes    HPI Jill Vincent is a 23 y.o. female here today for follow up of ADHD.  Currently taking concerta 36mg  daily.  This continues to work well for her.  She does also have adderall 10mg  but only uses this if needing to study in the evening for school.  She does not need a refill of this today.  She denies side effects from medications including insomnia, chest pain, palpitations or shortness of breath.   ROS:  A comprehensive ROS was completed and negative except as noted per HPI  Allergies  Allergen Reactions  . Wheat Bran     Past Medical History:  Diagnosis Date  . Asthma   . Celiac disease   . Eczema     Past Surgical History:  Procedure Laterality Date  . ADENOIDECTOMY    . TONSILLECTOMY    . UPPER GI ENDOSCOPY  06/27/2017    Social History   Socioeconomic History  . Marital status: Single    Spouse name: Not on file  . Number of children: Not on file  . Years of education: Not on file  . Highest education level: Not on file  Occupational History  . Not on file  Social Needs  . Financial resource strain: Not on file  . Food insecurity:    Worry: Not on file    Inability: Not on file  . Transportation needs:    Medical: Not on file    Non-medical: Not on file  Tobacco Use  . Smoking status: Never Smoker  . Smokeless tobacco: Never Used  Substance and Sexual Activity  . Alcohol use: Yes    Comment: social   . Drug use: Never  . Sexual activity: Not on file  Lifestyle  . Physical activity:    Days per week: Not on file    Minutes per session: Not on file  . Stress: Not on file  Relationships  . Social connections:    Talks on phone: Not on file    Gets together: Not on file    Attends religious service: Not on file    Active member of club or organization: Not on file    Attends  meetings of clubs or organizations: Not on file    Relationship status: Not on file  Other Topics Concern  . Not on file  Social History Narrative  . Not on file    Family History  Problem Relation Age of Onset  . Allergic rhinitis Other   . Eczema Sister   . Hyperthyroidism Sister   . Graves' disease Sister   . Hypertension Father   . Diabetes Paternal Uncle   . Hypertension Maternal Grandmother   . Cancer Maternal Grandmother   . Kidney cancer Maternal Grandmother   . Breast cancer Maternal Grandmother   . Heart disease Maternal Grandfather   . Stroke Maternal Grandfather   . Cancer Maternal Grandfather   . Lung cancer Maternal Grandfather   . Cancer Paternal Grandmother   . Lung cancer Paternal Grandmother   . Cancer Paternal Grandfather   . Lung cancer Paternal Grandfather   . Urticaria Neg Hx     Health Maintenance  Topic Date Due  . PAP SMEAR-Modifier  12/10/2014  . HIV Screening  05/19/2019 (Originally 12/09/2008)  . PAP-Cervical Cytology Screening  01/04/2021  .  TETANUS/TDAP  02/09/2027  . INFLUENZA VACCINE  Completed    ----------------------------------------------------------------------------------------------------------------------------------------------------------------------------------------------------------------- Physical Exam BP 130/68   Pulse 88   Temp 97.9 F (36.6 C) (Oral)   Ht 5\' 4"  (1.626 m)   Wt 204 lb (92.5 kg)   SpO2 98%   BMI 35.02 kg/m   Physical Exam Constitutional:      Appearance: Normal appearance.  HENT:     Head: Normocephalic and atraumatic.     Mouth/Throat:     Mouth: Mucous membranes are moist.  Eyes:     General: No scleral icterus. Neck:     Musculoskeletal: Neck supple.  Cardiovascular:     Rate and Rhythm: Normal rate and regular rhythm.     Heart sounds: Normal heart sounds.  Pulmonary:     Effort: Pulmonary effort is normal.     Breath sounds: Normal breath sounds.  Skin:    General: Skin is warm  and dry.     Findings: No rash.  Neurological:     General: No focal deficit present.     Mental Status: She is alert and oriented to person, place, and time.  Psychiatric:        Mood and Affect: Mood normal.        Behavior: Behavior normal.     ------------------------------------------------------------------------------------------------------------------------------------------------------------------------------------------------------------------- Assessment and Plan  Attention deficit hyperactivity disorder (ADHD), predominantly inattentive type -Stable, doing well with current dose of concerta.   -Concerta renewed -PDMP reviewed and UDS ordered today.

## 2018-08-03 NOTE — Assessment & Plan Note (Signed)
-  Stable, doing well with current dose of concerta.   -Concerta renewed -PDMP reviewed and UDS ordered today.

## 2018-08-06 ENCOUNTER — Other Ambulatory Visit: Payer: Self-pay | Admitting: Family Medicine

## 2018-08-09 ENCOUNTER — Ambulatory Visit: Payer: 59 | Admitting: Family Medicine

## 2018-08-09 ENCOUNTER — Ambulatory Visit (INDEPENDENT_AMBULATORY_CARE_PROVIDER_SITE_OTHER): Payer: 59 | Admitting: Psychology

## 2018-08-09 DIAGNOSIS — F411 Generalized anxiety disorder: Secondary | ICD-10-CM

## 2018-08-09 DIAGNOSIS — F331 Major depressive disorder, recurrent, moderate: Secondary | ICD-10-CM | POA: Diagnosis not present

## 2018-08-09 LAB — TOXASSURE SELECT 13 (MW), URINE

## 2018-08-17 ENCOUNTER — Ambulatory Visit: Payer: 59 | Admitting: Family Medicine

## 2018-08-23 ENCOUNTER — Ambulatory Visit (INDEPENDENT_AMBULATORY_CARE_PROVIDER_SITE_OTHER): Payer: 59 | Admitting: Psychology

## 2018-08-23 DIAGNOSIS — F331 Major depressive disorder, recurrent, moderate: Secondary | ICD-10-CM

## 2018-08-23 DIAGNOSIS — F411 Generalized anxiety disorder: Secondary | ICD-10-CM | POA: Diagnosis not present

## 2018-09-06 ENCOUNTER — Ambulatory Visit (INDEPENDENT_AMBULATORY_CARE_PROVIDER_SITE_OTHER): Payer: 59 | Admitting: Psychology

## 2018-09-06 DIAGNOSIS — F411 Generalized anxiety disorder: Secondary | ICD-10-CM | POA: Diagnosis not present

## 2018-09-06 DIAGNOSIS — F331 Major depressive disorder, recurrent, moderate: Secondary | ICD-10-CM | POA: Diagnosis not present

## 2018-09-11 ENCOUNTER — Other Ambulatory Visit: Payer: Self-pay | Admitting: Family Medicine

## 2018-09-11 DIAGNOSIS — F900 Attention-deficit hyperactivity disorder, predominantly inattentive type: Secondary | ICD-10-CM

## 2018-09-11 MED ORDER — METHYLPHENIDATE HCL ER (OSM) 36 MG PO TBCR: EXTENDED_RELEASE_TABLET | ORAL | 0 refills | Status: DC

## 2018-09-11 NOTE — Telephone Encounter (Signed)
Copied from CRM [id number]. Topic: Quick Communication - Rx Refill/Question >> Sep 11, 2018  1:05 PM Amado Coe, RN wrote: Medication: Methylphenidate 36mg    Has the patient contacted their pharmacy? No. (Agent: If no, request that the patient contact the pharmacy for the refill.) (Agent: If yes, when and what did the pharmacy advise?)  Preferred Pharmacy (with phone number or street name): [facility] [address]  Agent: Please be advised that RX refills may take up to 3 business days. We ask that you follow-up with your pharmacy.

## 2018-09-11 NOTE — Telephone Encounter (Signed)
Rx refill requested . Sent to Dr. Ashley Royalty to [facility].

## 2018-09-20 ENCOUNTER — Ambulatory Visit (INDEPENDENT_AMBULATORY_CARE_PROVIDER_SITE_OTHER): Payer: 59 | Admitting: Psychology

## 2018-09-20 DIAGNOSIS — F331 Major depressive disorder, recurrent, moderate: Secondary | ICD-10-CM

## 2018-09-20 DIAGNOSIS — F411 Generalized anxiety disorder: Secondary | ICD-10-CM | POA: Diagnosis not present

## 2018-10-04 ENCOUNTER — Ambulatory Visit: Payer: 59 | Admitting: Psychology

## 2018-10-18 ENCOUNTER — Ambulatory Visit: Payer: 59 | Admitting: Psychology

## 2018-10-25 ENCOUNTER — Other Ambulatory Visit: Payer: Self-pay | Admitting: Allergy and Immunology

## 2018-11-01 ENCOUNTER — Encounter: Payer: Self-pay | Admitting: Family Medicine

## 2018-11-01 ENCOUNTER — Ambulatory Visit (INDEPENDENT_AMBULATORY_CARE_PROVIDER_SITE_OTHER): Payer: 59 | Admitting: Family Medicine

## 2018-11-01 ENCOUNTER — Ambulatory Visit: Payer: 59 | Admitting: Psychology

## 2018-11-01 DIAGNOSIS — F419 Anxiety disorder, unspecified: Secondary | ICD-10-CM

## 2018-11-01 DIAGNOSIS — F900 Attention-deficit hyperactivity disorder, predominantly inattentive type: Secondary | ICD-10-CM

## 2018-11-01 MED ORDER — METHYLPHENIDATE HCL ER (OSM) 36 MG PO TBCR: EXTENDED_RELEASE_TABLET | ORAL | 0 refills | Status: DC

## 2018-11-01 NOTE — Assessment & Plan Note (Signed)
-  Stable at this time, continue current medications.

## 2018-11-01 NOTE — Assessment & Plan Note (Signed)
-  Symptoms are well controlled with current medication, will continue.  -PDMP reviewed -F/u in 3 months.

## 2018-11-01 NOTE — Progress Notes (Signed)
Jill MillinerMargaret Vincent - 23 y.o. female MRN [id number]  Date of birth: 08/30/1995   This visit type was conducted due to national recommendations for restrictions regarding the COVID-19 Pandemic (e.g. social distancing).  This format is felt to be most appropriate for this patient at this time.  All issues noted in this document were discussed and addressed.  No physical exam was performed (except for noted visual exam findings with Video Visits).  I discussed the limitations of evaluation and management by telemedicine and the availability of in person appointments. The patient expressed understanding and agreed to proceed.  I connected with@ on 11/01/18 at  2:00 PM EDT by a video enabled telemedicine application and verified that I am speaking with the correct person using two identifiers.   Patient Location: Home [address] [place] [id number]   [facility]  Chief Complaint  Patient presents with  . ADHD    3 Mo F/U , Rx refill     HPI  Jill MillinerMargaret Vincent is a 23 y.o. female who presents via audio/video conferencing for a telehealth visit today.  She is following up today for ADHD.  She reports good control of symptoms with current medications.  Rarely using adderall in the afternoon, mainly just concerta in the morning.  She has been working from home mostly during COVID pandemic.  She denies side effects from medication including palpitations, headache, shortness of breath, insomnia or worsening anxiety.     ROS:  A comprehensive ROS was completed and negative except as noted per HPI  Past Medical History:  Diagnosis Date  . Asthma   . Celiac disease   . Eczema     Past Surgical History:  Procedure Laterality Date  . ADENOIDECTOMY    . TONSILLECTOMY    . UPPER GI ENDOSCOPY  06/27/2017    Family History  Problem Relation Age of Onset  . Allergic rhinitis Other   . Eczema Sister   . Hyperthyroidism Sister   . Graves' disease Sister    . Hypertension Father   . Diabetes Paternal Uncle   . Hypertension Maternal Grandmother   . Cancer Maternal Grandmother   . Kidney cancer Maternal Grandmother   . Breast cancer Maternal Grandmother   . Heart disease Maternal Grandfather   . Stroke Maternal Grandfather   . Cancer Maternal Grandfather   . Lung cancer Maternal Grandfather   . Cancer Paternal Grandmother   . Lung cancer Paternal Grandmother   . Cancer Paternal Grandfather   . Lung cancer Paternal Grandfather   . Urticaria Neg Hx     Social History   Socioeconomic History  . Marital status: Single    Spouse name: Not on file  . Number of children: Not on file  . Years of education: Not on file  . Highest education level: Not on file  Occupational History  . Not on file  Social Needs  . Financial resource strain: Not on file  . Food insecurity:    Worry: Not on file    Inability: Not on file  . Transportation needs:    Medical: Not on file    Non-medical: Not on file  Tobacco Use  . Smoking status: Never Smoker  . Smokeless tobacco: Never Used  Substance and Sexual Activity  . Alcohol use: Yes    Comment: social   . Drug use: Never  . Sexual activity: Not on file  Lifestyle  . Physical activity:    Days  per week: Not on file    Minutes per session: Not on file  . Stress: Not on file  Relationships  . Social connections:    Talks on phone: Not on file    Gets together: Not on file    Attends religious service: Not on file    Active member of club or organization: Not on file    Attends meetings of clubs or organizations: Not on file    Relationship status: Not on file  . Intimate partner violence:    Fear of current or ex partner: Not on file    Emotionally abused: Not on file    Physically abused: Not on file    Forced sexual activity: Not on file  Other Topics Concern  . Not on file  Social History Narrative  . Not on file     Current Outpatient Medications:  .  ALPRAZolam (XANAX) 0.25  MG tablet, Take 0.25 mg by mouth daily as needed., Disp: , Rfl: 0 .  amphetamine-dextroamphetamine (ADDERALL) 10 MG tablet, Take 10 mg by mouth., Disp: , Rfl:  .  beclomethasone (QVAR) 40 MCG/ACT inhaler, Inhale into the lungs 2 (two) times daily., Disp: , Rfl:  .  busPIRone (BUSPAR) 15 MG tablet, TAKE 1/2 TABLET TWICE A DAY, Disp: 90 tablet, Rfl: 2 .  DULoxetine (CYMBALTA) 30 MG capsule, Take by mouth 3 (three) times daily. , Disp: , Rfl: 5 .  ipratropium-albuterol (DUONEB) 0.5-2.5 (3) MG/3ML SOLN, Inhale into the lungs., Disp: , Rfl:  .  methylphenidate 36 MG PO CR tablet, TAKE 1 TABLET BY MOUTH EVERY DAY IN THE MORNING, Disp: 30 tablet, Rfl: 0 .  montelukast (SINGULAIR) 10 MG tablet, TAKE 1 TABLET ONCE A DAY/SEASONALLY, Disp: 90 tablet, Rfl: 1 .  Norethindrone-Ethinyl Estradiol-Fe Biphas (LO LOESTRIN FE) 1 MG-10 MCG / 10 MCG tablet, TAKE 1 TABLET BY MOUTH EVERY DAY, Disp: , Rfl:  .  olopatadine (PATANOL) 0.1 % ophthalmic solution, INSTILL 1 DROP INTO THE AFFECTED EYE(S) TWICE A DAY AS NEEDED, Disp: , Rfl: 0 .  omeprazole (PRILOSEC) 10 MG capsule, Take 10 mg by mouth daily., Disp: , Rfl:  .  VENTOLIN HFA 108 (90 Base) MCG/ACT inhaler, USE 2 PUFFS AS NEEDED EVERY 6 HRS, Disp: 18 Inhaler, Rfl: 1  EXAM:  VITALS per patient if applicable: Temp (!) 95.6 F (35.3 C) (Oral)   Ht 5\' 4"  (1.626 m)   Wt 204 lb (92.5 kg)   BMI 35.02 kg/m   GENERAL: alert, oriented, appears well and in no acute distress  HEENT: atraumatic, conjunttiva clear, no obvious abnormalities on inspection of external nose and ears  NECK: normal movements of the head and neck  LUNGS: on inspection no signs of respiratory distress, breathing rate appears normal, no obvious gross SOB, gasping or wheezing  CV: no obvious cyanosis  MS: moves all visible extremities without noticeable abnormality  PSYCH/NEURO: pleasant and cooperative, no obvious depression or anxiety, speech and thought processing grossly intact   ASSESSMENT AND PLAN:  Discussed the following assessment and plan:  Attention deficit hyperactivity disorder (ADHD), predominantly inattentive type -Symptoms are well controlled with current medication, will continue.  -PDMP reviewed -F/u in 3 months.   Anxiety -Stable at this time, continue current medications.        I discussed the assessment and treatment plan with the patient. The patient was provided an opportunity to ask questions and all were answered. The patient agreed with the plan and demonstrated an understanding of the instructions.  The patient was advised to call back or seek an in-person evaluation if the symptoms worsen or if the condition fails to improve as anticipated.     Luetta Nutting, DO

## 2018-11-15 ENCOUNTER — Ambulatory Visit: Payer: 59 | Admitting: Psychology

## 2018-11-15 ENCOUNTER — Other Ambulatory Visit: Payer: Self-pay | Admitting: Family Medicine

## 2018-11-21 ENCOUNTER — Ambulatory Visit: Payer: 59 | Admitting: Allergy and Immunology

## 2018-12-18 ENCOUNTER — Other Ambulatory Visit: Payer: Self-pay | Admitting: Family Medicine

## 2018-12-18 ENCOUNTER — Encounter: Payer: Self-pay | Admitting: Family Medicine

## 2018-12-18 DIAGNOSIS — F900 Attention-deficit hyperactivity disorder, predominantly inattentive type: Secondary | ICD-10-CM

## 2018-12-18 MED ORDER — METHYLPHENIDATE HCL ER (OSM) 36 MG PO TBCR: EXTENDED_RELEASE_TABLET | ORAL | 0 refills | Status: DC

## 2018-12-18 NOTE — Telephone Encounter (Signed)
Please advise. MyCHart message was sent to schedule appt for VV for migraine maintanace medication options.

## 2018-12-19 ENCOUNTER — Ambulatory Visit (INDEPENDENT_AMBULATORY_CARE_PROVIDER_SITE_OTHER): Payer: 59 | Admitting: Family Medicine

## 2018-12-19 ENCOUNTER — Encounter: Payer: Self-pay | Admitting: Family Medicine

## 2018-12-19 ENCOUNTER — Other Ambulatory Visit: Payer: Self-pay

## 2018-12-19 VITALS — BP 114/76 | HR 110 | Temp 98.1°F | Resp 18 | Ht 64.0 in | Wt 214.0 lb

## 2018-12-19 DIAGNOSIS — G43011 Migraine without aura, intractable, with status migrainosus: Secondary | ICD-10-CM | POA: Diagnosis not present

## 2018-12-19 DIAGNOSIS — G43909 Migraine, unspecified, not intractable, without status migrainosus: Secondary | ICD-10-CM | POA: Insufficient documentation

## 2018-12-19 MED ORDER — KETOROLAC TROMETHAMINE 60 MG/2ML IM SOLN
60.0000 mg | Freq: Once | INTRAMUSCULAR | Status: AC
  Administered 2018-12-19: 60 mg via INTRAMUSCULAR

## 2018-12-19 MED ORDER — TOPIRAMATE 50 MG PO TABS: ORAL_TABLET | ORAL | 3 refills | Status: DC

## 2018-12-19 MED ORDER — ONDANSETRON HCL 4 MG/2ML IJ SOLN
4.0000 mg | Freq: Once | INTRAMUSCULAR | Status: AC
  Administered 2018-12-19: 4 mg via INTRAMUSCULAR

## 2018-12-19 MED ORDER — DEXAMETHASONE SODIUM PHOSPHATE 10 MG/ML IJ SOLN
5.0000 mg | Freq: Once | INTRAMUSCULAR | Status: AC
  Administered 2018-12-19: 5 mg via INTRAMUSCULAR

## 2018-12-19 NOTE — Progress Notes (Signed)
Jill Vincent - 23 y.o. female MRN [id number]  Date of birth: Feb 23, 1996  Subjective Chief Complaint  Patient presents with  . Headache    active migraine today, onset 7 days, discuss medication options    HPI Jill Vincent is a 23 y.o. female here today with complaint of migraine.  She reports that current migraine started about 1 week ago.  Started off mild but built into severe headache with nausea and light sensitivity over the past few days.  This is the typical pattern for her migraines. She reports that she has mild-moderate headache about 3x/week which are typically responsive to tylenol/nsaid or maxalt.  She is interested in trying something for prevention of her migraine.  She would also like something for treatment of her acute migraine.  She has already tried ONE[employer] x2 without relief.  She denies vision changes, weakness, numbness, tingling or changes to speech.   ROS:  A comprehensive ROS was completed and negative except as noted per HPI  Allergies  Allergen Reactions  . Wheat Bran     Past Medical History:  Diagnosis Date  . Asthma   . Celiac disease   . Eczema     Past Surgical History:  Procedure Laterality Date  . ADENOIDECTOMY    . TONSILLECTOMY    . UPPER GI ENDOSCOPY  06/27/2017    Social History   Socioeconomic History  . Marital status: Single    Spouse name: Not on file  . Number of children: Not on file  . Years of education: Not on file  . Highest education level: Not on file  Occupational History  . Not on file  Social Needs  . Financial resource strain: Not on file  . Food insecurity    Worry: Not on file    Inability: Not on file  . Transportation needs    Medical: Not on file    Non-medical: Not on file  Tobacco Use  . Smoking status: Never Smoker  . Smokeless tobacco: Never Used  Substance and Sexual Activity  . Alcohol use: Yes    Comment: social   . Drug use: Never  . Sexual activity: Not on file  Lifestyle  . Physical  activity    Days per week: Not on file    Minutes per session: Not on file  . Stress: Not on file  Relationships  . Social Herbalist on phone: Not on file    Gets together: Not on file    Attends religious service: Not on file    Active member of club or organization: Not on file    Attends meetings of clubs or organizations: Not on file    Relationship status: Not on file  Other Topics Concern  . Not on file  Social History Narrative  . Not on file    Family History  Problem Relation Age of Onset  . Allergic rhinitis Other   . Eczema Sister   . Hyperthyroidism Sister   . Graves' disease Sister   . Hypertension Father   . Diabetes Paternal Uncle   . Hypertension Maternal Grandmother   . Cancer Maternal Grandmother   . Kidney cancer Maternal Grandmother   . Breast cancer Maternal Grandmother   . Heart disease Maternal Grandfather   . Stroke Maternal Grandfather   . Cancer Maternal Grandfather   . Lung cancer Maternal Grandfather   . Cancer Paternal Grandmother   . Lung cancer Paternal Grandmother   . Cancer Paternal Grandfather   .  Lung cancer Paternal Grandfather   . Urticaria Neg Hx     Health Maintenance  Topic Date Due  . PAP SMEAR-Modifier  12/10/2014  . HIV Screening  05/19/2019 (Originally 12/09/2008)  . INFLUENZA VACCINE  01/13/2019  . PAP-Cervical Cytology Screening  01/04/2021  . TETANUS/TDAP  02/09/2027    ----------------------------------------------------------------------------------------------------------------------------------------------------------------------------------------------------------------- Physical Exam BP 114/76   Pulse (!) 110   Temp 98.1 F (36.7 C) (Oral)   Resp 18   Ht 5\' 4"  (1.626 m)   Wt 214 lb (97.1 kg)   SpO2 97%   BMI 36.73 kg/m   Physical Exam Constitutional:      Appearance: She is well-developed.     Comments: Sitting in darkened room.   HENT:     Head: Normocephalic and atraumatic.      Mouth/Throat:     Mouth: Mucous membranes are moist.  Eyes:     Extraocular Movements: Extraocular movements intact.     Right eye: Normal extraocular motion and no nystagmus.     Left eye: Normal extraocular motion and no nystagmus.     Pupils: Pupils are equal, round, and reactive to light.  Neck:     Musculoskeletal: Normal range of motion.  Cardiovascular:     Rate and Rhythm: Normal rate and regular rhythm.  Pulmonary:     Effort: Pulmonary effort is normal.     Breath sounds: Normal breath sounds.  Skin:    General: Skin is warm and dry.     Findings: No rash.  Neurological:     Mental Status: She is alert.     Cranial Nerves: No cranial nerve deficit, dysarthria or facial asymmetry.     Sensory: No sensory deficit.     Coordination: Romberg sign negative.     Gait: Gait normal.  Psychiatric:        Mood and Affect: Mood normal. Mood is not anxious.     ------------------------------------------------------------------------------------------------------------------------------------------------------------------------------------------------------------------- Assessment and Plan  Migraine -Given injections of toradol, zofran and dexamethasone today.  -Instructed to rest in darkened room and stay well hydrated.  -Discussed prophylactic medications including propranolol, topiramate and tricyclics.  I think topiramate would be best option with lowest side effect profile and low interaction with other medications.  She is agreeable to this and will titrate up over the next two weeks.  -I will see her back in 6-8 weeks.

## 2018-12-19 NOTE — Assessment & Plan Note (Signed)
-  Given injections of toradol, zofran and dexamethasone today.  -Instructed to rest in darkened room and stay well hydrated.  -Discussed prophylactic medications including propranolol, topiramate and tricyclics.  I think topiramate would be best option with lowest side effect profile and low interaction with other medications.  She is agreeable to this and will titrate up over the next two weeks.  -I will see her back in 6-8 weeks.

## 2018-12-19 NOTE — Patient Instructions (Signed)

## 2019-01-04 ENCOUNTER — Encounter: Payer: Self-pay | Admitting: Family Medicine

## 2019-01-08 ENCOUNTER — Other Ambulatory Visit: Payer: Self-pay | Admitting: Family Medicine

## 2019-01-08 MED ORDER — ALPRAZOLAM 0.25 MG PO TABS: 0.2500 mg | ORAL_TABLET | Freq: Every day | ORAL | 1 refills | Status: DC | PRN

## 2019-01-08 MED ORDER — RIZATRIPTAN BENZOATE 10 MG PO TABS: ORAL_TABLET | ORAL | 3 refills | Status: DC

## 2019-01-08 MED ORDER — RIZATRIPTAN BENZOATE 10 MG PO TABS: ORAL_TABLET | ORAL | 3 refills | Status: AC

## 2019-01-22 ENCOUNTER — Telehealth: Payer: Self-pay | Admitting: Family Medicine

## 2019-01-22 NOTE — Telephone Encounter (Signed)
Pt is aware, appt change to virtual visit.

## 2019-01-22 NOTE — Telephone Encounter (Signed)
Yes, ok for VV.

## 2019-01-22 NOTE — Telephone Encounter (Signed)
Dr. Zigmund Daniel please advise, this is 6 wks follow up on migraines. Can this be virtual?     Copied from [place] [id number]. Topic: Appointment Scheduling - Scheduling Inquiry for Clinic >> Jan 22, 2019 10:30 AM Oneta Rack wrote: Patient would like to know if 01/31/2019 appointment, if virtual would like to keep appointment if in office patient would like to [facility] due to her starting a new job and would like to [facility], please advise

## 2019-01-31 ENCOUNTER — Telehealth (INDEPENDENT_AMBULATORY_CARE_PROVIDER_SITE_OTHER): Payer: 59 | Admitting: Family Medicine

## 2019-01-31 ENCOUNTER — Encounter: Payer: Self-pay | Admitting: Family Medicine

## 2019-01-31 DIAGNOSIS — F900 Attention-deficit hyperactivity disorder, predominantly inattentive type: Secondary | ICD-10-CM | POA: Diagnosis not present

## 2019-01-31 DIAGNOSIS — G43819 Other migraine, intractable, without status migrainosus: Secondary | ICD-10-CM | POA: Diagnosis not present

## 2019-01-31 NOTE — Progress Notes (Signed)
Jill Vincent - 23 y.o. female MRN [id number]  Date of birth: Nov 07, 1995   This visit type was conducted due to national recommendations for restrictions regarding the COVID-19 Pandemic (e.g. social distancing).  This format is felt to be most appropriate for this patient at this time.  All issues noted in this document were discussed and addressed.  No physical exam was performed (except for noted visual exam findings with Video Visits).  I discussed the limitations of evaluation and management by telemedicine and the availability of in person appointments. The patient expressed understanding and agreed to proceed.  I connected with@ on 01/31/19 at  2:00 PM EDT by a video enabled telemedicine application and verified that I am speaking with the correct person using two identifiers.   Patient Location: Home [address] [place] [id number]   [facility]  Chief Complaint  Patient presents with  . Follow-up    no complaints/less headaches    HPI  Jill Vincent is a [age] y.o. female who presents via audio/video conferencing for a telehealth visit today.  She is following up today for migraines and ADD.   -Migraines:  Started on topiramate at last visit due to increased frequency of migraines.  Reports that migraines have decreased in frequency.  She is tolerating medication well and denies significant side effects at current dose.    -ADD:  Current management with concerta which continues to work well for her.  She recently started a new job and may have a formulary change that would require a change in medication but would prefer to stay on concerta as this is working well.  She denies side effects including increased anxiety, insomnia    ROS:  A comprehensive ROS was completed and negative except as noted per HPI  Past Medical History:  Diagnosis Date  . Asthma   . Celiac disease   . Eczema     Past Surgical History:  Procedure  Laterality Date  . ADENOIDECTOMY    . TONSILLECTOMY    . UPPER GI ENDOSCOPY  06/27/2017    Family History  Problem Relation Age of Onset  . Allergic rhinitis Other   . Eczema Sister   . Hyperthyroidism Sister   . Graves' disease Sister   . Hypertension Father   . Diabetes Paternal Uncle   . Hypertension Maternal Grandmother   . Cancer Maternal Grandmother   . Kidney cancer Maternal Grandmother   . Breast cancer Maternal Grandmother   . Heart disease Maternal Grandfather   . Stroke Maternal Grandfather   . Cancer Maternal Grandfather   . Lung cancer Maternal Grandfather   . Cancer Paternal Grandmother   . Lung cancer Paternal Grandmother   . Cancer Paternal Grandfather   . Lung cancer Paternal Grandfather   . Urticaria Neg Hx     Social History   Socioeconomic History  . Marital status: Single    Spouse name: Not on file  . Number of children: Not on file  . Years of education: Not on file  . Highest education level: Not on file  Occupational History  . Not on file  Social Needs  . Financial resource strain: Not on file  . Food insecurity    Worry: Not on file    Inability: Not on file  . Transportation needs    Medical: Not on file    Non-medical: Not on file  Tobacco Use  . Smoking status: Never Smoker  . Smokeless  tobacco: Never Used  Substance and Sexual Activity  . Alcohol use: Yes    Comment: social   . Drug use: Never  . Sexual activity: Not on file  Lifestyle  . Physical activity    Days per week: Not on file    Minutes per session: Not on file  . Stress: Not on file  Relationships  . Social Herbalist on phone: Not on file    Gets together: Not on file    Attends religious service: Not on file    Active member of club or organization: Not on file    Attends meetings of clubs or organizations: Not on file    Relationship status: Not on file  . Intimate partner violence    Fear of current or ex partner: Not on file    Emotionally  abused: Not on file    Physically abused: Not on file    Forced sexual activity: Not on file  Other Topics Concern  . Not on file  Social History Narrative  . Not on file     Current Outpatient Medications:  .  ALPRAZolam (XANAX) 0.25 MG tablet, Take 1 tablet (0.25 mg total) by mouth daily as needed., Disp: 30 tablet, Rfl: 1 .  amphetamine-dextroamphetamine (ADDERALL) 10 MG tablet, Take 10 mg by mouth., Disp: , Rfl:  .  beclomethasone (QVAR) 40 MCG/ACT inhaler, Inhale into the lungs 2 (two) times daily., Disp: , Rfl:  .  DULoxetine (CYMBALTA) 30 MG capsule, Take by mouth 3 (three) times daily. , Disp: , Rfl: 5 .  ipratropium-albuterol (DUONEB) 0.5-2.5 (3) MG/3ML SOLN, Inhale into the lungs., Disp: , Rfl:  .  methylphenidate 36 MG PO CR tablet, TAKE 1 TABLET BY MOUTH EVERY DAY IN THE MORNING, Disp: 30 tablet, Rfl: 0 .  montelukast (SINGULAIR) 10 MG tablet, TAKE 1 TABLET ONCE A DAY/SEASONALLY, Disp: 90 tablet, Rfl: 1 .  Norethindrone-Ethinyl Estradiol-Fe Biphas (LO LOESTRIN FE) 1 MG-10 MCG / 10 MCG tablet, TAKE 1 TABLET BY MOUTH EVERY DAY, Disp: , Rfl:  .  olopatadine (PATANOL) 0.1 % ophthalmic solution, INSTILL 1 DROP INTO THE AFFECTED EYE(S) TWICE A DAY AS NEEDED, Disp: , Rfl: 0 .  omeprazole (PRILOSEC) 40 MG capsule, TAKE 1 CAPSULE BY MOUTH EVERY DAY, Disp: 90 capsule, Rfl: 0 .  rizatriptan (MAXALT) 10 MG tablet, TAKE 1 TABLET BY MOUTH EVERY 2 HOURS AS NEEDED. MAX 2 TABS/DAY, Disp: 10 tablet, Rfl: 3 .  topiramate (TOPAMAX) 50 MG tablet, Take PO 25mg  daily x1 week, then 50mg  x1 week then 50mg  BID, Disp: 60 tablet, Rfl: 3 .  VENTOLIN HFA 108 (90 Base) MCG/ACT inhaler, USE 2 PUFFS AS NEEDED EVERY 6 HRS, Disp: 18 Inhaler, Rfl: 1 .  busPIRone (BUSPAR) 15 MG tablet, TAKE 1/2 TABLET TWICE A DAY (Patient not taking: Reported on 01/31/2019), Disp: 90 tablet, Rfl: 2  EXAM:  VITALS per patient if applicable: There were no vitals taken for this visit.  GENERAL: alert, oriented, appears well and in  no acute distress  HEENT: atraumatic, conjunttiva clear, no obvious abnormalities on inspection of external nose and ears  NECK: normal movements of the head and neck  LUNGS: on inspection no signs of respiratory distress, breathing rate appears normal, no obvious gross SOB, gasping or wheezing  CV: no obvious cyanosis  MS: moves all visible extremities without noticeable abnormality  PSYCH/NEURO: pleasant and cooperative, no obvious depression or anxiety, speech and thought processing grossly intact  ASSESSMENT AND PLAN:  Discussed the following assessment and plan:  Migraine -Improved since starting topamax. She is tolerating well, will plan to continue.   Attention deficit hyperactivity disorder (ADHD), predominantly inattentive type -Stable with current dose of concerta -F/u in 3 months.        I discussed the assessment and treatment plan with the patient. The patient was provided an opportunity to ask questions and all were answered. The patient agreed with the plan and demonstrated an understanding of the instructions.   The patient was advised to call back or seek an in-person evaluation if the symptoms worsen or if the condition fails to improve as anticipated.   Everrett Coombeody Saharah Sherrow, DO

## 2019-01-31 NOTE — Assessment & Plan Note (Signed)
-  Improved since starting topamax. She is tolerating well, will plan to continue.

## 2019-01-31 NOTE — Assessment & Plan Note (Signed)
-  Stable with current dose of concerta -F/u in 3 months.

## 2019-02-06 ENCOUNTER — Other Ambulatory Visit: Payer: Self-pay | Admitting: Family Medicine

## 2019-02-06 DIAGNOSIS — F900 Attention-deficit hyperactivity disorder, predominantly inattentive type: Secondary | ICD-10-CM

## 2019-02-07 MED ORDER — METHYLPHENIDATE HCL ER (OSM) 36 MG PO TBCR: EXTENDED_RELEASE_TABLET | ORAL | 0 refills | Status: DC

## 2019-03-19 ENCOUNTER — Other Ambulatory Visit: Payer: Self-pay | Admitting: Family Medicine

## 2019-03-19 DIAGNOSIS — G43011 Migraine without aura, intractable, with status migrainosus: Secondary | ICD-10-CM

## 2019-03-23 ENCOUNTER — Other Ambulatory Visit: Payer: Self-pay | Admitting: Family Medicine

## 2019-03-23 DIAGNOSIS — F900 Attention-deficit hyperactivity disorder, predominantly inattentive type: Secondary | ICD-10-CM

## 2019-03-23 MED ORDER — OMEPRAZOLE 40 MG PO CPDR: DELAYED_RELEASE_CAPSULE | ORAL | 1 refills | Status: AC

## 2019-03-26 MED ORDER — METHYLPHENIDATE HCL ER (OSM) 36 MG PO TBCR: EXTENDED_RELEASE_TABLET | ORAL | 0 refills | Status: DC

## 2019-04-25 ENCOUNTER — Other Ambulatory Visit: Payer: Self-pay | Admitting: Family Medicine

## 2019-04-25 DIAGNOSIS — F900 Attention-deficit hyperactivity disorder, predominantly inattentive type: Secondary | ICD-10-CM

## 2019-04-25 MED ORDER — METHYLPHENIDATE HCL ER (OSM) 36 MG PO TBCR: EXTENDED_RELEASE_TABLET | ORAL | 0 refills | Status: DC

## 2019-04-27 ENCOUNTER — Encounter: Payer: Self-pay | Admitting: Family Medicine

## 2019-04-27 MED ORDER — DULOXETINE HCL 30 MG PO CPEP: 30.0000 mg | ORAL_CAPSULE | Freq: Three times a day (TID) | ORAL | 2 refills | Status: DC

## 2019-05-07 ENCOUNTER — Encounter: Payer: Self-pay | Admitting: Family Medicine

## 2019-05-07 ENCOUNTER — Telehealth (INDEPENDENT_AMBULATORY_CARE_PROVIDER_SITE_OTHER): Payer: 59 | Admitting: Family Medicine

## 2019-05-07 ENCOUNTER — Ambulatory Visit: Payer: Self-pay | Admitting: *Deleted

## 2019-05-07 VITALS — Temp 96.7°F | Ht 64.0 in

## 2019-05-07 DIAGNOSIS — Z20828 Contact with and (suspected) exposure to other viral communicable diseases: Secondary | ICD-10-CM | POA: Diagnosis not present

## 2019-05-07 DIAGNOSIS — Z20822 Contact with and (suspected) exposure to covid-19: Secondary | ICD-10-CM | POA: Insufficient documentation

## 2019-05-07 NOTE — Progress Notes (Signed)
Jill Vincent - 23 y.o. female MRN [id number]  Date of birth: 01-Jul-1995   This visit type was conducted due to national recommendations for restrictions regarding the COVID-19 Pandemic (e.g. social distancing).  This format is felt to be most appropriate for this patient at this time.  All issues noted in this document were discussed and addressed.  No physical exam was performed (except for noted visual exam findings with Video Visits).  I discussed the limitations of evaluation and management by telemedicine and the availability of in person appointments. The patient expressed understanding and agreed to proceed.  I connected with@ on 05/07/19 at 10:20 AM EST by a video enabled telemedicine application and verified that I am speaking with the correct person using two identifiers.  Present for visit: Alexsys Eskin  Patient Location: [place] [id number]   Provider location:   [place]  Chief Complaint  Patient presents with  . Sinusitis    pt said she's having reg allergy symptoms, sore throat,running nose, headaches, mild cough and congestion. Started 2 days ago.    HPI  Jill Vincent is a 23 y.o. female who presents via audio/video conferencing for a telehealth visit today.  She reports 2 day history of congestion, sore throat, increased headache, mild cough and mild achy feeling.  Initially thought to be allergies but symptoms have progressed and she reports that she is feeling worse today.  She denies fever, chills, chest tightness, or shortness of breath. 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   She works remotely the remaining 4 days of the week.    ROS:  A comprehensive ROS was completed and negative except as noted per HPI  Past Medical History:  Diagnosis Date  . Asthma   . Celiac disease   . Eczema     Past Surgical History:  Procedure Laterality Date  . ADENOIDECTOMY     . TONSILLECTOMY    . UPPER GI ENDOSCOPY  06/27/2017    Family History  Problem Relation Age of Onset  . Allergic rhinitis Other   . Eczema Sister   . Hyperthyroidism Sister   . Graves' disease Sister   . Hypertension Father   . Diabetes Paternal Uncle   . Hypertension Maternal Grandmother   . Cancer Maternal Grandmother   . Kidney cancer Maternal Grandmother   . Breast cancer Maternal Grandmother   . Heart disease Maternal Grandfather   . Stroke Maternal Grandfather   . Cancer Maternal Grandfather   . Lung cancer Maternal Grandfather   . Cancer Paternal Grandmother   . Lung cancer Paternal Grandmother   . Cancer Paternal Grandfather   . Lung cancer Paternal Grandfather   . Urticaria Neg Hx     Social History   Socioeconomic History  . Marital status: Single    Spouse name: Not on file  . Number of children: Not on file  . Years of education: Not on file  . Highest education level: Not on file  Occupational History  . Not on file  Social Needs  . Financial resource strain: Not on file  . Food insecurity    Worry: Not on file    Inability: Not on file  . Transportation needs    Medical: Not on file    Non-medical: Not on file  Tobacco Use  . Smoking status: Never Smoker  . Smokeless tobacco: Never Used  Substance and  Sexual Activity  . Alcohol use: Yes    Comment: social   . Drug use: Never  . Sexual activity: Not on file  Lifestyle  . Physical activity    Days per week: Not on file    Minutes per session: Not on file  . Stress: Not on file  Relationships  . Social Musicianconnections    Talks on phone: Not on file    Gets together: Not on file    Attends religious service: Not on file    Active member of club or organization: Not on file    Attends meetings of clubs or organizations: Not on file    Relationship status: Not on file  . Intimate partner violence    Fear of current or ex partner: Not on file    Emotionally abused: Not on file    Physically  abused: Not on file    Forced sexual activity: Not on file  Other Topics Concern  . Not on file  Social History Narrative  . Not on file     Current Outpatient Medications:  .  ALPRAZolam (XANAX) 0.25 MG tablet, Take 1 tablet (0.25 mg total) by mouth daily as needed., Disp: 30 tablet, Rfl: 1 .  amphetamine-dextroamphetamine (ADDERALL) 10 MG tablet, Take 10 mg by mouth., Disp: , Rfl:  .  beclomethasone (QVAR) 40 MCG/ACT inhaler, Inhale into the lungs 2 (two) times daily., Disp: , Rfl:  .  DULoxetine (CYMBALTA) 30 MG capsule, Take 1 capsule (30 mg total) by mouth 3 (three) times daily., Disp: 270 capsule, Rfl: 2 .  ipratropium-albuterol (DUONEB) 0.5-2.5 (3) MG/3ML SOLN, Inhale into the lungs., Disp: , Rfl:  .  methylphenidate 36 MG PO CR tablet, TAKE 1 TABLET BY MOUTH EVERY DAY IN THE MORNING, Disp: 30 tablet, Rfl: 0 .  montelukast (SINGULAIR) 10 MG tablet, TAKE 1 TABLET ONCE A DAY/SEASONALLY, Disp: 90 tablet, Rfl: 1 .  Norethindrone-Ethinyl Estradiol-Fe Biphas (LO LOESTRIN FE) 1 MG-10 MCG / 10 MCG tablet, TAKE 1 TABLET BY MOUTH EVERY DAY, Disp: , Rfl:  .  olopatadine (PATANOL) 0.1 % ophthalmic solution, INSTILL 1 DROP INTO THE AFFECTED EYE(S) TWICE A DAY AS NEEDED, Disp: , Rfl: 0 .  omeprazole (PRILOSEC) 40 MG capsule, TAKE 1 CAPSULE BY MOUTH EVERY DAY, Disp: 90 capsule, Rfl: 1 .  rizatriptan (MAXALT) 10 MG tablet, TAKE 1 TABLET BY MOUTH EVERY 2 HOURS AS NEEDED. MAX 2 TABS/DAY, Disp: 10 tablet, Rfl: 3 .  VENTOLIN HFA 108 (90 Base) MCG/ACT inhaler, USE 2 PUFFS AS NEEDED EVERY 6 HRS, Disp: 18 Inhaler, Rfl: 1  EXAM:  VITALS per patient if applicable: Temp (!) 96.7 F (35.9 C) (Oral) Comment: per pt Comment (Src): per pt.  Ht 5\' 4"  (1.626 m)   BMI 36.73 kg/m   GENERAL: alert, oriented, appears well and in no acute distress  HEENT: atraumatic, conjunttiva clear, no obvious abnormalities on inspection of external nose and ears  NECK: normal movements of the head and neck  LUNGS: on  inspection no signs of respiratory distress, breathing rate appears normal, no obvious gross SOB, gasping or wheezing  CV: no obvious cyanosis  MS: moves all visible extremities without noticeable abnormality  PSYCH/NEURO: pleasant and cooperative, no obvious depression or anxiety, speech and thought processing grossly intact  ASSESSMENT AND PLAN:  Discussed the following assessment and plan:  Suspected COVID-19 virus infection Recommend she have COVID testing given symptoms as she works in a Geologist, engineeringHealthcare setting. -Self isolate until results return. If positive  she will need to quarantine for a minimum of 10 days and until symptoms are improving.   -No signs of worsening asthma symptoms at this time.  -Rest, fluids and continue symptomatic treatment as needed -Call for any new or worsening symptoms.    >25 minutes spent with patient with 50% of time spent providing counseling and/or coordination of care.     I discussed the assessment and treatment plan with the patient. The patient was provided an opportunity to ask questions and all were answered. The patient agreed with the plan and demonstrated an understanding of the instructions.   The patient was advised to call back or seek an in-person evaluation if the symptoms worsen or if the condition fails to improve as anticipated.   Everrett Coombe, DO

## 2019-05-07 NOTE — Assessment & Plan Note (Signed)
Recommend she have COVID testing given symptoms as she works in a Biochemist, clinical. -Self isolate until results return. If positive she will need to quarantine for a minimum of 10 days and until symptoms are improving.   -No signs of worsening asthma symptoms at this time.  -Rest, fluids and continue symptomatic treatment as needed -Call for any new or worsening symptoms.

## 2019-05-07 NOTE — Telephone Encounter (Signed)
Contacted pt regarding her symptoms; she is having a sore throat/congestion/headache. 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 [employer], and is not sure if she has been exposed to [place]; the pt started having symptoms on 05/05/2019; she says her sore throat is most worrisome; she has taken Mucinex, and her productions are clear; she would like to know if she should be tested; pt advise that there are community testing sites that do not require MD's order; recommendations given per nurse triage protocol; additional instructions given to pt: Non-Test Criteria for Ending Self-Isolation All persons with fever and respiratory symptoms should isolate themselves until ALL conditions listed below are met: - at least 10 days since symptoms onset - AND 3 consecutive days fever free without antipyretics (acetaminophen [Tylenol] or ibuprofen [Advil]) - AND improvement in respiratory symptoms She verbalized understanding; the pt sees sees Dr Luetta Nutting, [facility]; pt transferred to [facility] for scheduling.    Reason for Disposition . Earache also present  Answer Assessment - Initial Assessment Questions 1. ONSET: "When did the throat start hurting?" (Hours or days ago)      05/05/2019 2. SEVERITY: "How bad is the sore throat?" (Scale 1-10; mild, moderate or severe)   - MILD (1-3):  doesn't interfere with eating or normal activities   - MODERATE (4-7): interferes with eating some solids and normal activities   - SEVERE (8-10):  excruciating pain, interferes with most normal activities   - SEVERE DYSPHAGIA: can't swallow liquids, drooling    mild 3. STREP EXPOSURE: "Has there been any exposure to strep within the past week?" If so, ask: "What type of contact occurred?"    no 4.  VIRAL SYMPTOMS: "Are there any symptoms of a cold, such as a runny nose, cough, hoarse voice or red eyes?"      Post  nasal drip, cough, headache 5. FEVER: "Do you have a fever?" If so, ask: "What is your temperature, how was it measured, and when did it start?"    no 6. PUS ON THE TONSILS: "Is there pus on the tonsils in the back of your throat?"    no 7. OTHER SYMPTOMS: "Do you have any other symptoms?" (e.g., difficulty breathing, headache, rash)    Throat redness; ear discomfort 8. PREGNANCY: "Is there any chance you are pregnant?" "When was your last menstrual period?"     Takes continuous birth control and did have spotting  Protocols used: [place]

## 2019-05-08 ENCOUNTER — Encounter (INDEPENDENT_AMBULATORY_CARE_PROVIDER_SITE_OTHER): Payer: Self-pay

## 2019-05-08 ENCOUNTER — Encounter: Payer: Self-pay | Admitting: Family Medicine

## 2019-05-09 ENCOUNTER — Other Ambulatory Visit: Payer: Self-pay

## 2019-05-09 MED ORDER — BECLOMETHASONE DIPROPIONATE 40 MCG/ACT IN AERS: 2.0000 | INHALATION_SPRAY | Freq: Two times a day (BID) | RESPIRATORY_TRACT | 1 refills | Status: DC

## 2019-06-03 ENCOUNTER — Other Ambulatory Visit: Payer: Self-pay | Admitting: Family Medicine

## 2020-05-18 ENCOUNTER — Encounter: Payer: Self-pay | Admitting: Family Medicine

## 2020-05-22 ENCOUNTER — Emergency Department (HOSPITAL_COMMUNITY)
Admission: EM | Admit: 2020-05-22 | Discharge: 2020-05-22 | Disposition: A | Discharge status: Home / Self Care | Payer: BC Managed Care – PPO | Attending: Emergency Medicine | Admitting: Emergency Medicine

## 2020-05-22 ENCOUNTER — Encounter (HOSPITAL_COMMUNITY): Payer: Self-pay

## 2020-05-22 ENCOUNTER — Other Ambulatory Visit: Payer: Self-pay

## 2020-05-22 DIAGNOSIS — J45909 Unspecified asthma, uncomplicated: Secondary | ICD-10-CM | POA: Diagnosis not present

## 2020-05-22 DIAGNOSIS — Z8616 Personal history of COVID-19: Secondary | ICD-10-CM | POA: Diagnosis not present

## 2020-05-22 DIAGNOSIS — M542 Cervicalgia: Secondary | ICD-10-CM | POA: Diagnosis present

## 2020-05-22 DIAGNOSIS — Y9389 Activity, other specified: Secondary | ICD-10-CM | POA: Diagnosis not present

## 2020-05-22 DIAGNOSIS — Z7951 Long term (current) use of inhaled steroids: Secondary | ICD-10-CM | POA: Insufficient documentation

## 2020-05-22 DIAGNOSIS — M7918 Myalgia, other site: Secondary | ICD-10-CM

## 2020-05-22 DIAGNOSIS — Y92410 Unspecified street and highway as the place of occurrence of the external cause: Secondary | ICD-10-CM | POA: Insufficient documentation

## 2020-05-22 DIAGNOSIS — M791 Myalgia, unspecified site: Secondary | ICD-10-CM | POA: Insufficient documentation

## 2020-05-22 MED ORDER — MELOXICAM 7.5 MG PO TABS: 7.5000 mg | ORAL_TABLET | Freq: Every day | ORAL | 0 refills | Status: DC

## 2020-05-22 MED ORDER — METHOCARBAMOL 500 MG PO TABS: 500.0000 mg | ORAL_TABLET | Freq: Two times a day (BID) | ORAL | 0 refills | Status: AC

## 2020-05-22 MED ORDER — METHOCARBAMOL 500 MG PO TABS: 500.0000 mg | ORAL_TABLET | Freq: Two times a day (BID) | ORAL | 0 refills | Status: DC

## 2020-05-22 MED ORDER — MELOXICAM 7.5 MG PO TABS: 7.5000 mg | ORAL_TABLET | Freq: Every day | ORAL | 0 refills | Status: AC

## 2020-05-22 NOTE — ED Notes (Signed)
An After Visit Summary was printed and given to the patient. Discharge instructions given and no further questions at this time.  
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
## 2020-05-22 NOTE — Discharge Instructions (Addendum)
I have prescribed a short course of anti-inflammatories along with muscle relaxers to help with your pain.  Please take this medication as prescribed.  If you experience any headache, chest pain, worsening symptoms please return to the emergency department.

## 2020-10-12 HISTORY — PX: COLONOSCOPY: SHX174

## 2021-02-17 ENCOUNTER — Emergency Department (HOSPITAL_BASED_OUTPATIENT_CLINIC_OR_DEPARTMENT_OTHER): Payer: BC Managed Care – PPO

## 2021-02-17 ENCOUNTER — Emergency Department (HOSPITAL_BASED_OUTPATIENT_CLINIC_OR_DEPARTMENT_OTHER)
Admission: EM | Admit: 2021-02-17 | Discharge: 2021-02-17 | Disposition: A | Discharge status: Home / Self Care | Payer: BC Managed Care – PPO | Attending: Emergency Medicine | Admitting: Emergency Medicine

## 2021-02-17 ENCOUNTER — Encounter (HOSPITAL_BASED_OUTPATIENT_CLINIC_OR_DEPARTMENT_OTHER): Payer: Self-pay | Admitting: Emergency Medicine

## 2021-02-17 ENCOUNTER — Other Ambulatory Visit: Payer: Self-pay

## 2021-02-17 DIAGNOSIS — R109 Unspecified abdominal pain: Secondary | ICD-10-CM | POA: Diagnosis present

## 2021-02-17 DIAGNOSIS — R000 Tachycardia, unspecified: Secondary | ICD-10-CM | POA: Diagnosis not present

## 2021-02-17 DIAGNOSIS — J45909 Unspecified asthma, uncomplicated: Secondary | ICD-10-CM | POA: Insufficient documentation

## 2021-02-17 DIAGNOSIS — R102 Pelvic and perineal pain: Secondary | ICD-10-CM | POA: Diagnosis not present

## 2021-02-17 DIAGNOSIS — R110 Nausea: Secondary | ICD-10-CM | POA: Diagnosis not present

## 2021-02-17 DIAGNOSIS — Z7951 Long term (current) use of inhaled steroids: Secondary | ICD-10-CM | POA: Diagnosis not present

## 2021-02-17 HISTORY — DX: Unspecified ovarian cyst, unspecified side: N83.209

## 2021-02-17 LAB — COMPREHENSIVE METABOLIC PANEL
ALT: 28 U/L (ref 0–44)
AST: 16 U/L (ref 15–41)
Albumin: 4.1 g/dL (ref 3.5–5.0)
Alkaline Phosphatase: 82 U/L (ref 38–126)
Anion gap: 11 (ref 5–15)
BUN: 8 mg/dL (ref 6–20)
CO2: 26 mmol/L (ref 22–32)
Calcium: 8.8 mg/dL — ABNORMAL LOW (ref 8.9–10.3)
Chloride: 99 mmol/L (ref 98–111)
Creatinine, Ser: 0.95 mg/dL (ref 0.44–1.00)
GFR, Estimated: >60 mL/min (ref >60)
Glucose, Bld: 83 mg/dL (ref 70–99)
Potassium: 3 mmol/L — ABNORMAL LOW (ref 3.5–5.1)
Sodium: 136 mmol/L (ref 135–145)
Total Bilirubin: 0.4 mg/dL (ref 0.3–1.2)
Total Protein: 8.3 g/dL — ABNORMAL HIGH (ref 6.5–8.1)

## 2021-02-17 LAB — CBC WITH DIFFERENTIAL/PLATELET
Abs Immature Granulocytes: 0.02 10*3/uL (ref 0.00–0.07)
Basophils Absolute: 0.1 10*3/uL (ref 0.0–0.1)
Basophils Relative: 1 %
Eosinophils Absolute: 0.1 10*3/uL (ref 0.0–0.5)
Eosinophils Relative: 1 %
HCT: 43.9 % (ref 36.0–46.0)
Hemoglobin: 14.2 g/dL (ref 12.0–15.0)
Immature Granulocytes: 0 %
Lymphocytes Relative: 36 %
Lymphs Abs: 2.6 10*3/uL (ref 0.7–4.0)
MCH: 27.2 pg (ref 26.0–34.0)
MCHC: 32.3 g/dL (ref 30.0–36.0)
MCV: 83.9 fL (ref 80.0–100.0)
Monocytes Absolute: 0.6 10*3/uL (ref 0.1–1.0)
Monocytes Relative: 8 %
Neutro Abs: 3.9 10*3/uL (ref 1.7–7.7)
Neutrophils Relative %: 54 %
Platelets: 454 10*3/uL — ABNORMAL HIGH (ref 150–400)
RBC: 5.23 MIL/uL — ABNORMAL HIGH (ref 3.87–5.11)
RDW: 13.4 % (ref 11.5–15.5)
WBC: 7.3 10*3/uL (ref 4.0–10.5)
nRBC: 0 % (ref 0.0–0.2)

## 2021-02-17 LAB — URINALYSIS, ROUTINE W REFLEX MICROSCOPIC
Bilirubin Urine: NEGATIVE
Glucose, UA: NEGATIVE mg/dL
Ketones, ur: NEGATIVE mg/dL
Leukocytes,Ua: NEGATIVE
Nitrite: NEGATIVE
Protein, ur: NEGATIVE mg/dL
Specific Gravity, Urine: 1.015 (ref 1.005–1.030)
pH: 6 (ref 5.0–8.0)

## 2021-02-17 LAB — WET PREP, GENITAL
Clue Cells Wet Prep HPF POC: NONE SEEN
Sperm: NONE SEEN
Trich, Wet Prep: NONE SEEN
Yeast Wet Prep HPF POC: NONE SEEN

## 2021-02-17 LAB — PREGNANCY, URINE: Preg Test, Ur: NEGATIVE

## 2021-02-17 MED ORDER — IOHEXOL 350 MG/ML SOLN
75.0000 mL | Freq: Once | INTRAVENOUS | Status: AC | PRN
  Administered 2021-02-17: 75 mL via INTRAVENOUS

## 2021-02-17 MED ORDER — KETOROLAC TROMETHAMINE 15 MG/ML IJ SOLN
15.0000 mg | Freq: Once | INTRAMUSCULAR | Status: AC
  Administered 2021-02-17: 15 mg via INTRAVENOUS
  Filled 2021-02-17: qty 1

## 2021-02-17 MED ORDER — MORPHINE SULFATE (PF) 2 MG/ML IV SOLN
2.0000 mg | Freq: Once | INTRAVENOUS | Status: AC
  Administered 2021-02-17: 2 mg via INTRAVENOUS
  Filled 2021-02-17: qty 1

## 2021-02-17 MED ORDER — ONDANSETRON HCL 4 MG/2ML IJ SOLN
4.0000 mg | Freq: Once | INTRAMUSCULAR | Status: AC
  Administered 2021-02-17: 4 mg via INTRAVENOUS
  Filled 2021-02-17: qty 2

## 2021-02-17 NOTE — Discharge Instructions (Addendum)
You were seen in the ER today for your left lower abdominal/pelvic pain.  Your physical exam, vital signs, blood work, ultrasound and CT scan were very reassuring.  There is not appear to be any torsion of your ovary or any mass in the abdomen.  Suspect your pain is secondary to your recurring ovarian cysts, as well as menstrual pain given your current vaginal bleeding.  Please follow-up with your OB as scheduled for Thursday, and return to the ER with any new severe symptoms.

## 2021-02-17 NOTE — ED Provider Notes (Signed)
[facility] EMERGENCY DEPT Provider Note   CSN: [id number] Arrival date & time: 02/17/21  1437     History Chief Complaint  Patient presents with   Abdominal Pain    Jill Vincent is a 25 y.o. female with history of recurrent ovarian cysts who presents with concern for left lower abdominal pain with associated vaginal bleeding since yesterday.  Patient states she is on continuous birth control to help with recurring ovarian cysts, however yesterday started what seems to be her normal menstrual period, despite continuous oral birth control.  Does have an appoint with her OB/GYN in 48 hours, however pain was poorly managed at home with Tylenol, ibuprofen, and remaining prescription of hydrocodone from the past.  Patient endorses associated nausea without vomiting, fevers, or chills.  Denies any diarrhea but does endorse some decrease in urine output due to lack of appetite.  Patient states she has not been sexually active in over 1 year, denies any increased vaginal discharge.  I personally reviewed this patient's medical records.  She has history of celiac disease, migraines, ADHD on methylphenidate, and recurrent ovarian cysts.  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     Family History  Problem Relation Age of Onset   Allergic rhinitis Other    Eczema Sister    Hyperthyroidism Sister    Graves' disease Sister    Hypertension Father    Diabetes Paternal Uncle    Hypertension Maternal Grandmother    Cancer Maternal Grandmother    Kidney cancer Maternal Grandmother    Breast cancer Maternal Grandmother    Heart disease Maternal Grandfather    Stroke Maternal Grandfather    Cancer Maternal Grandfather    Lung cancer Maternal Grandfather    Cancer Paternal Grandmother    Lung cancer Paternal Grandmother    Cancer Paternal Grandfather    Lung cancer Paternal Grandfather    Urticaria Neg Hx     Social History   Tobacco Use   Smoking status: Never   Smokeless tobacco: Never  Vaping Use   Vaping Use: Never used  Substance Use Topics   Alcohol use: Yes    Comment: social    Drug use: Never    Home Medications Prior to Admission medications   Medication Sig Start Date End Date Taking? Authorizing Provider  dicyclomine (BENTYL) 10 MG capsule Take 10 mg by mouth 4 (four) times daily -  before meals and at bedtime.   Yes [provider]  DULoxetine (CYMBALTA) 30 MG capsule Take 1 capsule (30 mg total) by mouth 3 (three) times daily. 04/27/19 02/17/21 Yes Everrett Coombe, DO  methylphenidate (RITALIN LA) 40 MG 24 hr capsule Take 40  mg by mouth every morning.   Yes [provider]  Norethindrone-Ethinyl Estradiol-Fe Biphas (LO LOESTRIN FE) 1 MG-10 MCG / 10 MCG tablet TAKE 1 TABLET BY MOUTH EVERY DAY 07/17/18  Yes [provider]  omeprazole (PRILOSEC) 40 MG capsule TAKE 1 CAPSULE BY MOUTH EVERY DAY 03/23/19  Yes Everrett Coombe, DO  ALPRAZolam Prudy Feeler) 0.25 MG tablet Take 1 tablet (0.25 mg total) by mouth daily as needed. 01/08/19   Everrett Coombe, DO  methylphenidate 36 MG PO CR tablet TAKE 1 TABLET BY MOUTH EVERY DAY IN THE MORNING Patient not taking: No sig reported 04/25/19   Everrett Coombe,  DO  montelukast (SINGULAIR) 10 MG tablet TAKE 1 TABLET ONCE A DAY/SEASONALLY Patient not taking: No sig reported 10/25/18   Kozlow, Alvira Philips, MD  QVAR REDIHALER 40 MCG/ACT inhaler TAKE 2 PUFFS BY MOUTH TWICE A DAY 06/03/19   Everrett Coombe, DO  rizatriptan (MAXALT) 10 MG tablet TAKE 1 TABLET BY MOUTH EVERY 2 HOURS AS NEEDED. MAX 2 TABS/DAY 01/08/19   Everrett Coombe, DO  VENTOLIN HFA 108 [phone] Base) MCG/ACT inhaler USE 2 PUFFS AS NEEDED EVERY 6 HRS 08/06/18   Everrett Coombe, DO    Allergies    Gluten meal, Sulfa antibiotics, and Wheat bran  Review of Systems   Review of Systems  Constitutional:  Positive for appetite change. Negative for activity change, chills, diaphoresis, fatigue and fever.  HENT: Negative.    Respiratory: Negative.    Cardiovascular: Negative.   Gastrointestinal:  Positive for abdominal pain and nausea. Negative for blood in stool, constipation, diarrhea and vomiting.  Genitourinary:  Positive for flank pain, menstrual problem, pelvic pain and vaginal bleeding. Negative for decreased urine volume, difficulty urinating, dyspareunia, dysuria, enuresis, frequency, genital sores, hematuria, urgency, vaginal discharge and vaginal pain.  Skin: Negative.    Physical Exam Updated Vital Signs BP 123/86 (BP Location: Right Arm)   Pulse 76   Temp 98.3 F (36.8 C) (Oral)   Resp 18   Ht  (1.626 m)   Wt 95.3 kg   SpO2 100%   BMI 36.05 kg/m   Physical Exam Vitals and nursing note reviewed. Exam conducted with a chaperone present.  Constitutional:      Appearance: She is obese. She is not ill-appearing or toxic-appearing.  HENT:     Head: Normocephalic and atraumatic.     Nose: Nose normal.     Mouth/Throat:     Mouth: Mucous membranes are moist.     Pharynx: Oropharynx is clear. Uvula midline. No oropharyngeal exudate or posterior oropharyngeal erythema.  Eyes:     General: Lids are normal. Vision grossly intact.        Right eye: No discharge.        Left eye: No  discharge.     Extraocular Movements: Extraocular movements intact.     Conjunctiva/sclera: Conjunctivae normal.  Neck:     Trachea: Trachea normal.  Cardiovascular:     Rate and Rhythm: Regular rhythm. Tachycardia present.     Pulses: Normal pulses.     Heart sounds: Normal heart sounds. No murmur heard. Pulmonary:     Effort: Pulmonary effort is normal. No tachypnea, bradypnea, accessory muscle usage, prolonged expiration or respiratory distress.     Breath sounds: Normal breath sounds. No wheezing or rales.  Chest:     Chest wall: No mass, lacerations, deformity, swelling, tenderness, crepitus or edema.  Abdominal:     General: Bowel sounds are normal. There is no distension.  Palpations: Abdomen is soft.     Tenderness: There is abdominal tenderness in the periumbilical area, suprapubic area and left lower quadrant. There is left CVA tenderness. There is no right CVA tenderness, guarding or rebound. Negative signs include Murphy's sign and McBurney's sign.     Hernia: No hernia is present.  Genitourinary:    General: Normal vulva.     Exam position: Lithotomy position.     Vagina: Bleeding present.     Cervix: Normal.     Uterus: Normal.      Adnexa: Right adnexa normal.       Left: Tenderness and fullness present.   Musculoskeletal:        General: No deformity.     Cervical back: Normal range of motion and neck supple. No edema, rigidity or crepitus. No pain with movement, spinous process tenderness or muscular tenderness.     Right lower leg: No edema.     Left lower leg: No edema.  Lymphadenopathy:     Cervical: No cervical adenopathy.  Skin:    General: Skin is warm and dry.     Capillary Refill: Capillary refill takes less than 2 seconds.  Neurological:     General: No focal deficit present.     Mental Status: She is alert and oriented to person, place, and time. Mental status is at baseline.     Gait: Gait is intact.  Psychiatric:        Mood and Affect: Mood  normal.    ED Results / Procedures / Treatments   Labs (all labs ordered are listed, but only abnormal results are displayed) Labs Reviewed  WET PREP, GENITAL - Abnormal; Notable for the following components:      Result Value   WBC, Wet Prep HPF POC FEW (*)    All other components within normal limits  CBC WITH DIFFERENTIAL/PLATELET - Abnormal; Notable for the following components:   RBC 5.23 (*)    Platelets 454 (*)    All other components within normal limits  COMPREHENSIVE METABOLIC PANEL - Abnormal; Notable for the following components:   Potassium 3.0 (*)    Calcium 8.8 (*)    Total Protein 8.3 (*)    All other components within normal limits  URINALYSIS, ROUTINE W REFLEX MICROSCOPIC - Abnormal; Notable for the following components:   Hgb urine dipstick LARGE (*)    All other components within normal limits  PREGNANCY, URINE    EKG None  Radiology CT Abdomen Pelvis W Contrast  Result Date: 02/17/2021 CLINICAL DATA:  Left lower quadrant abdominal pain EXAM: CT ABDOMEN AND PELVIS WITH CONTRAST TECHNIQUE: Multidetector CT imaging of the abdomen and pelvis was performed using the standard protocol following bolus administration of intravenous contrast. CONTRAST:  75mL OMNIPAQUE IOHEXOL 350 MG/ML SOLN COMPARISON:  Pelvic ultrasound today FINDINGS: Lower chest: No acute abnormality Hepatobiliary: No focal hepatic abnormality. Gallbladder unremarkable. Pancreas: No focal abnormality or ductal dilatation. Spleen: No focal abnormality.  Normal size. Adrenals/Urinary Tract: No adrenal abnormality. No focal renal abnormality. No stones or hydronephrosis. Urinary bladder is unremarkable. Stomach/Bowel: Normal appendix. Stomach, large and small bowel grossly unremarkable. Vascular/Lymphatic: No evidence of aneurysm or adenopathy. Reproductive: Small anterior uterine fibroid.  No adnexal mass. Other: No free fluid or free air. Musculoskeletal: No acute bony abnormality. IMPRESSION: No acute  findings in the abdomen or pelvis. Electronically Signed   By: Charlett NoseKevin  Dover M.D.   On: 02/17/2021 19:50   US PELVIC COMPLETE W TRANSVAGINAL AND TORSION R/O  Result Date: 02/17/2021 CLINICAL DATA:  Breakthrough bleeding EXAM: TRANSABDOMINAL AND TRANSVAGINAL ULTRASOUND OF PELVIS DOPPLER ULTRASOUND OF OVARIES TECHNIQUE: Both transabdominal and transvaginal ultrasound examinations of the pelvis were performed. Transabdominal technique was performed for global imaging of the pelvis including uterus, ovaries, adnexal regions, and pelvic cul-de-sac. It was necessary to proceed with endovaginal exam following the transabdominal exam to visualize the uterus endometrium ovaries. Color and duplex Doppler ultrasound was utilized to evaluate blood flow to the ovaries. COMPARISON:  Pelvic ultrasound 09/11/2015 FINDINGS: Uterus Measurements: 7.4 x 3.3 x 4.4 cm = volume: 56.1 mL. Posterior intramural uterine corpus mass measuring 11 x 7 x 8 mm. Exophytic right fundal mass measuring 9 x 11 x 13 mm. Endometrium Thickness: 2.2 mm.  No focal abnormality visualized. Right ovary Measurements: 4.3 x 2.4 x 1.4 cm = volume: 10.3 mL. Normal appearance/no adnexal mass. Position limits Doppler evaluation. Left ovary Measurements: 3.5 x 1.3 x 2.7 cm = volume: 6.4 mL. Normal appearance/no adnexal mass. Position limits Doppler evaluation Pulsed Doppler evaluation of both ovaries demonstrates normal low-resistance arterial and venous waveforms. Other findings No abnormal free fluid. IMPRESSION: 1. Slightly limited Doppler evaluation of the ovaries secondary to ovarian position. No definitive torsion or ovarian mass is seen. 2. Probable small uterine fibroids Electronically Signed   By: Jasmine Pang M.D.   On: 02/17/2021 17:52    Procedures Procedures   Medications Ordered in ED Medications  morphine 2 MG/ML injection 2 mg (2 mg Intravenous Given 02/17/21 1550)  ondansetron (ZOFRAN) injection 4 mg (4 mg Intravenous Given 02/17/21 1550)   iohexol (OMNIPAQUE) 350 MG/ML injection 75 mL (75 mLs Intravenous Contrast Given 02/17/21 1939)  ketorolac (TORADOL) 15 MG/ML injection 15 mg (15 mg Intravenous Given 02/17/21 2055)    ED Course  I have reviewed the triage vital signs and the nursing notes.  Pertinent labs & imaging results that were available during my care of the patient were reviewed by me and considered in my medical decision making (see chart for details).    MDM Rules/Calculators/A&P                         25 year old female who presents with concern for LLQ pain x 2 days with associated vaginal bleeding.   Differential diagnosis includes but is not limited diverticulitis, colitis, ovarian torsion, ovarian cyst, appendicitis, psoas abscess, PID, endometriosis, UTI, pyelonephritis, nephrolithiasis.  Patient is tachycardic on intake, hypertensive.  Vitals otherwise normal.  Cardiopulmonary exam with tachycardia without dysrhythmia, abdominal pain with left lower quadrant, suprapubic tenderness to palpation, worse in the left pelvis.  There is left-sided CVA tenderness.  CBC unremarkable, CMP hypokalemia with K of 3.0, patient is not pregnant and urine revealed large amount of hemoglobin but otherwise is unremarkable.  Pending pelvic ultrasound at this time.  Pelvic ultrasound equivocal as ovaries were not well visualized due to the position.  No definitive torsion or mass visualized.  Small uterine fibroids.  GU exam as above with left adnexal fullness and tenderness to palpation.  Do feel patient would benefit from further evaluation with CT scan and she is amenable to this plan.  CT scan negative for acute intra-abdominal pelvic abnormality.  Suspect patient's symptoms are secondary to her recurrent ovarian cyst, likely mittelschmerz as well given context of menstrual bleeding at this time.  Recommend OB/GYN follow-up as scheduled for 48 hours from now.  May continue use Tylenol and ibuprofen at home as needed as well as  heat application.  No further work-up warranted in the this time given reassuring physical exam, vital signs, blood work, and imaging.  Alfie voiced understanding of her medical evaluation and treatment plan.  Each of her questions was answered to her expressed satisfaction.  Return precautions were given.  Patient is well-appearing, stable, and appropriate for discharge at this time.  This chart was dictated using voice recognition software, Dragon. Despite the best efforts of this provider to proofread and correct errors, errors may still occur which can change documentation meaning.  Final Clinical Impression(s) / ED Diagnoses Final diagnoses:  Pelvic pain    Rx / DC Orders ED Discharge Orders     None        Sherrilee Gilles 02/17/21 2137    Lorre Nick, MD 02/18/21 1247

## 2021-02-17 NOTE — ED Triage Notes (Signed)
Pt to ER with c/o LLQ abdominal pain yesterday.  Pt states hx of ovarian cyst.  States she is on continuous birth control, but started breakthrough bleeding yesterday.  Pt states nausea, denies vomiting.  Pt states pain is similar to typical ovarian pain, but is more intense.

## 2021-02-19 ENCOUNTER — Ambulatory Visit (INDEPENDENT_AMBULATORY_CARE_PROVIDER_SITE_OTHER): Payer: BC Managed Care – PPO | Admitting: Student

## 2021-02-19 ENCOUNTER — Other Ambulatory Visit: Payer: Self-pay

## 2021-02-19 ENCOUNTER — Encounter: Payer: Self-pay | Admitting: Student

## 2021-02-19 ENCOUNTER — Other Ambulatory Visit (HOSPITAL_COMMUNITY)
Admission: RE | Admit: 2021-02-19 | Discharge: 2021-02-19 | Disposition: A | Discharge status: Home / Self Care | Payer: BC Managed Care – PPO | Source: Ambulatory Visit | Attending: Student | Admitting: Student

## 2021-02-19 ENCOUNTER — Other Ambulatory Visit: Payer: Self-pay | Admitting: Student

## 2021-02-19 VITALS — BP 124/98 | HR 116 | Ht 64.0 in | Wt 243.6 lb

## 2021-02-19 DIAGNOSIS — N920 Excessive and frequent menstruation with regular cycle: Secondary | ICD-10-CM

## 2021-02-19 DIAGNOSIS — Z01419 Encounter for gynecological examination (general) (routine) without abnormal findings: Secondary | ICD-10-CM | POA: Insufficient documentation

## 2021-02-19 DIAGNOSIS — Z1151 Encounter for screening for human papillomavirus (HPV): Secondary | ICD-10-CM | POA: Insufficient documentation

## 2021-02-19 DIAGNOSIS — Z118 Encounter for screening for other infectious and parasitic diseases: Secondary | ICD-10-CM | POA: Insufficient documentation

## 2021-02-19 MED ORDER — TRAMADOL-ACETAMINOPHEN 37.5-325 MG PO TABS: 1.0000 | ORAL_TABLET | Freq: Four times a day (QID) | ORAL | 0 refills | Status: DC | PRN

## 2021-02-19 MED ORDER — LEVONORGESTREL-ETHINYL ESTRAD 90-20 MCG PO TABS: 1.0000 | ORAL_TABLET | Freq: Every day | ORAL | 11 refills | Status: DC

## 2021-02-19 NOTE — Progress Notes (Signed)
    History:  Ms. Jill Vincent is a 25 y.o. No obstetric history on file. who presents to clinic today for ovarian pain and gyn exam. She reports that Lo-Loestrin has usually worked for her ovarian cysts and heavy/painful mesnes in the past. However, she has had 3 episodes of irregular bleeding in the past month. She has tried -has tried triphasic BC, sprintec, nuvaring. She reports that her menstrual cramping is in her back more. Her period usually starts with spotting and then a big flow. Lasts 2-4 days. Heavy day is usually involved with clotting, then period stops. She cannot take any more NSAIDS because of her stomach pain. She was also recently diagnosed with celiac disease. She was recently in the ER for LLQ pain; she was concerned for ovarian torsion. She had CT scan, which was negative.  The following portions of the patient's history were reviewed and updated as appropriate: allergies, current medications, family history, past medical history, social history, past surgical history and problem list.  She also desires STI testing.   Review of Systems:  Review of Systems  Constitutional: Negative.   HENT: Negative.    Respiratory: Negative.    Cardiovascular: Negative.   Gastrointestinal:  Positive for abdominal pain.  Genitourinary: Negative.   Skin: Negative.   Neurological: Negative.   Psychiatric/Behavioral: Negative.       Objective:  Physical Exam BP (!) 124/98   Pulse (!) 116   Ht 5\' 4"  (1.626 m)   Wt 243 lb 9.6 oz (110.5 kg)   LMP  (LMP Unknown)   BMI 41.81 kg/m  Physical Exam Constitutional:      Appearance: Normal appearance.  HENT:     Head: Normocephalic.  Abdominal:     Palpations: Abdomen is soft.  Genitourinary:    General: Normal vulva.     Vagina: No vaginal discharge.  Musculoskeletal:        General: Normal range of motion.  Neurological:     General: No focal deficit present.     Mental Status: She is alert.  Psychiatric:        Mood and  Affect: Mood normal.  Breast exam benign; no lumps, masses palpated    Labs and Imaging No results found for this or any previous visit (from the past 24 hour(s)).  No results found.  Health Maintenance Due  Topic Date Due   Pneumococcal Vaccine 67-86 Years old (1 - PCV) Never done   HIV Screening  Never done   Hepatitis C Screening  Never done   PAP SMEAR-Modifier  Never done   PAP-Cervical Cytology Screening  01/04/2021     Assessment & Plan:  1. Menorrhagia with regular cycle -will switch to different OCP that is more specific for ovarian cysts -given tramadol for occasional breakthrough pain - HIV antibody (with reflex) - RPR - GC/Chlamydia Probe Amp - Hepatitis B Surface AntiGEN - Hepatitis C Antibody - Vitamin D 1,25 dihydroxy  2. Well woman exam with routine gynecological exam  - Cervicovaginal ancillary only( [facility]) - Cytology - PAP( [facility])    Approximately 30 minutes of total time was spent with this patient on exam and counseling.   01/06/2021, CNM 02/20/2021 6:27 AM

## 2021-02-20 LAB — CERVICOVAGINAL ANCILLARY ONLY
Bacterial Vaginitis (gardnerella): NEGATIVE
Candida Glabrata: NEGATIVE
Candida Vaginitis: NEGATIVE
Chlamydia: NEGATIVE
Comment: NEGATIVE
Comment: NEGATIVE
Comment: NEGATIVE
Comment: NEGATIVE
Comment: NEGATIVE
Comment: NORMAL
Neisseria Gonorrhea: NEGATIVE
Trichomonas: NEGATIVE

## 2021-02-26 LAB — CYTOLOGY - PAP
Comment: NEGATIVE
Diagnosis: UNDETERMINED — AB
High risk HPV: POSITIVE — AB

## 2021-02-27 LAB — HEPATITIS B SURFACE ANTIGEN: Hepatitis B Surface Ag: NEGATIVE

## 2021-02-27 LAB — HEPATITIS C ANTIBODY: Hep C Virus Ab: <0.1 s/co ratio (ref 0.0–0.9)

## 2021-02-27 LAB — RPR: RPR Ser Ql: NONREACTIVE

## 2021-02-27 LAB — VITAMIN D 1,25 DIHYDROXY
Vitamin D 1, 25 (OH)2 Total: 58 pg/mL
Vitamin D2 1, 25 (OH)2: <10 pg/mL
Vitamin D3 1, 25 (OH)2: 58 pg/mL

## 2021-02-27 LAB — HIV ANTIBODY (ROUTINE TESTING W REFLEX): HIV Screen 4th Generation wRfx: NONREACTIVE

## 2021-03-01 ENCOUNTER — Encounter: Payer: Self-pay | Admitting: Student

## 2021-03-01 DIAGNOSIS — D069 Carcinoma in situ of cervix, unspecified: Secondary | ICD-10-CM

## 2021-03-01 DIAGNOSIS — R85610 Atypical squamous cells of undetermined significance on cytologic smear of anus (ASC-US): Secondary | ICD-10-CM | POA: Insufficient documentation

## 2021-03-01 HISTORY — DX: Carcinoma in situ of cervix, unspecified: D06.9

## 2021-04-01 ENCOUNTER — Encounter: Payer: Self-pay | Admitting: Family Medicine

## 2021-04-01 ENCOUNTER — Ambulatory Visit (INDEPENDENT_AMBULATORY_CARE_PROVIDER_SITE_OTHER): Payer: BC Managed Care – PPO | Admitting: Family Medicine

## 2021-04-01 ENCOUNTER — Other Ambulatory Visit: Payer: Self-pay

## 2021-04-01 ENCOUNTER — Other Ambulatory Visit (HOSPITAL_COMMUNITY)
Admission: RE | Admit: 2021-04-01 | Discharge: 2021-04-01 | Disposition: A | Discharge status: Home / Self Care | Payer: BC Managed Care – PPO | Source: Ambulatory Visit | Attending: Family Medicine | Admitting: Family Medicine

## 2021-04-01 VITALS — BP 124/84 | HR 102 | Wt 230.1 lb

## 2021-04-01 DIAGNOSIS — Z23 Encounter for immunization: Secondary | ICD-10-CM

## 2021-04-01 DIAGNOSIS — R87810 Cervical high risk human papillomavirus (HPV) DNA test positive: Secondary | ICD-10-CM | POA: Diagnosis not present

## 2021-04-01 DIAGNOSIS — Z3041 Encounter for surveillance of contraceptive pills: Secondary | ICD-10-CM

## 2021-04-01 DIAGNOSIS — R87610 Atypical squamous cells of undetermined significance on cytologic smear of cervix (ASC-US): Secondary | ICD-10-CM

## 2021-04-01 DIAGNOSIS — Z3202 Encounter for pregnancy test, result negative: Secondary | ICD-10-CM | POA: Diagnosis not present

## 2021-04-01 NOTE — Assessment & Plan Note (Signed)
allow time for her body to get used to this birth control. If no improvement in the next 2-3 months, consider change to Loestrin or Sprintec.

## 2021-04-01 NOTE — Assessment & Plan Note (Addendum)
S/p colop today--manage per ASCCP for results. Begin Gardasil series

## 2021-04-01 NOTE — Progress Notes (Signed)
   Subjective:    Patient ID: Jill Vincent is a 25 y.o. female presenting with Colposcopy  on 04/01/2021  HPI: Here for colpo--has h/o ASCUS with + HPV. She has not had her gardasil series. No h/o abnormal pap. On Seasonique x 1 months and having some breakthrough bleeding. Previously on LoLoestrin and having some breakthrough bleeding with that.  Review of Systems  Constitutional:  Negative for chills and fever.  Respiratory:  Negative for shortness of breath.   Cardiovascular:  Negative for chest pain.  Gastrointestinal:  Negative for abdominal pain, nausea and vomiting.  Genitourinary:  Negative for dysuria.  Skin:  Negative for rash.     Objective:    BP 124/84   Pulse (!) 102   Wt 230 lb 1.6 oz (104.4 kg)   LMP 03/22/2021 (Exact Date)   BMI 39.50 kg/m  Physical Exam Constitutional:      General: She is not in acute distress.    Appearance: She is well-developed.  HENT:     Head: Normocephalic and atraumatic.  Eyes:     General: No scleral icterus. Cardiovascular:     Rate and Rhythm: Normal rate.  Pulmonary:     Effort: Pulmonary effort is normal.  Abdominal:     Palpations: Abdomen is soft.  Musculoskeletal:     Cervical back: Neck supple.  Skin:    General: Skin is warm and dry.  Neurological:     Mental Status: She is alert and oriented to person, place, and time.   Procedure: Patient given informed consent, signed copy in the chart, time out was performed.  Placed in lithotomy position. Cervix viewed with speculum and colposcope after application of acetic acid.   Colposcopy adequate?  yes [place] lesions?few geographic regions anteriorly and posteriorly at SCJ Punctation?no Mosaicism?  no Abnormal vasculature?  no Biopsies?10, 2, 7 o'clock ECC?yes     Assessment & Plan:   Problem List Items Addressed This Visit       Unprioritized   Oral contraceptive pill surveillance    allow time for her body to get used to this birth control. If  no improvement in the next 2-3 months, consider change to Loestrin or Sprintec.      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 Begin Gardasil series      Relevant Orders   Surgical pathology( [facility]/ [facility])   Other Visit Diagnoses     Negative pregnancy test           Return if symptoms worsen or fail to improve.  Reva Bores 04/01/2021 2:45 PM

## 2021-04-02 LAB — POCT PREGNANCY, URINE: Preg Test, Ur: NEGATIVE

## 2021-04-03 LAB — SURGICAL PATHOLOGY

## 2021-04-23 ENCOUNTER — Encounter: Payer: Self-pay | Admitting: Family Medicine

## 2021-04-23 ENCOUNTER — Ambulatory Visit (INDEPENDENT_AMBULATORY_CARE_PROVIDER_SITE_OTHER): Payer: BC Managed Care – PPO | Admitting: Family Medicine

## 2021-04-23 ENCOUNTER — Other Ambulatory Visit (HOSPITAL_COMMUNITY)
Admission: RE | Admit: 2021-04-23 | Discharge: 2021-04-23 | Disposition: A | Discharge status: Home / Self Care | Payer: BC Managed Care – PPO | Source: Ambulatory Visit | Attending: Family Medicine | Admitting: Family Medicine

## 2021-04-23 ENCOUNTER — Other Ambulatory Visit: Payer: Self-pay

## 2021-04-23 VITALS — BP 114/77 | HR 70 | Wt 239.1 lb

## 2021-04-23 DIAGNOSIS — R87810 Cervical high risk human papillomavirus (HPV) DNA test positive: Secondary | ICD-10-CM | POA: Insufficient documentation

## 2021-04-23 DIAGNOSIS — R87610 Atypical squamous cells of undetermined significance on cytologic smear of cervix (ASC-US): Secondary | ICD-10-CM | POA: Insufficient documentation

## 2021-04-23 DIAGNOSIS — Z23 Encounter for immunization: Secondary | ICD-10-CM | POA: Diagnosis not present

## 2021-04-23 DIAGNOSIS — Z3202 Encounter for pregnancy test, result negative: Secondary | ICD-10-CM

## 2021-04-23 LAB — POCT PREGNANCY, URINE: Preg Test, Ur: NEGATIVE

## 2021-04-23 NOTE — Progress Notes (Signed)
Pt presents with positive depression screening.  Pt denies [name] as she is already being seen by a psychiatrist.    Jill Vincent  04/23/21

## 2021-04-23 NOTE — Progress Notes (Signed)
   GYNECOLOGY OFFICE PROCEDURE NOTE Jill Vincent is a 25 y.o. No obstetric history on file. here for LEEP. No GYN concerns. Pap smear and colposcopy history reviewed.    Pap ASCUS with HR HPV Colpo Biopsy suspicious for High grade lesion and possible glandular involvement ECC Negative  Risks, benefits, alternatives, and limitations of procedure explained to patient, including pain, bleeding, infection, failure to remove abnormal tissue and failure to cure dysplasia, need for repeat procedures, damage to pelvic organs, cervical incompetence.  Role of HPV,cervical dysplasia and need for close followup was empasized. Informed written consent was obtained. All questions were answered. Time out performed. Urine pregnancy test was negative.  ??Procedure: The patient was placed in lithotomy position and the bivalved coated speculum was placed in the patient's vagina. A grounding pad placed on the patient. Local anesthesia was administered via an intracervical block using 20 ml of 2% Lidocaine with epinephrine. Colposcopy performed. The suction was turned on and the Small 1X Fisher Cone Biopsy Excisor on 63 Watts of blended current was used to excise the entire transformation zone. Excellent hemostasis was achieved using roller ball coagulation set at 50 Watts coagulation current. Monsel's solution was then applied and the speculum was removed from the vagina. Specimens were sent to pathology.  ?The patient tolerated the procedure well. Post-operative instructions given to patient, including instruction to seek medical attention for persistent bright red bleeding, fever, abdominal/pelvic pain, dysuria, nausea or vomiting. She was also told about the possibility of having copious yellow to black tinged discharge for weeks. She was counseled to avoid anything in the vagina (sex/douching/tampons) for 3 weeks.   Reva Bores, MD 04/23/2021 10:33 PM

## 2021-04-27 LAB — SURGICAL PATHOLOGY

## 2021-04-28 ENCOUNTER — Other Ambulatory Visit: Payer: Self-pay | Admitting: Student

## 2021-04-28 MED ORDER — TRAMADOL-ACETAMINOPHEN 37.5-325 MG PO TABS: 1.0000 | ORAL_TABLET | Freq: Four times a day (QID) | ORAL | 0 refills | Status: DC | PRN

## 2021-04-28 MED ORDER — TRAMADOL HCL 50 MG PO TABS: 50.0000 mg | ORAL_TABLET | Freq: Four times a day (QID) | ORAL | 0 refills | Status: DC | PRN

## 2021-05-04 ENCOUNTER — Encounter: Payer: Self-pay | Admitting: Family Medicine

## 2021-06-02 ENCOUNTER — Ambulatory Visit: Payer: BC Managed Care – PPO

## 2021-06-11 ENCOUNTER — Ambulatory Visit: Payer: BC Managed Care – PPO | Admitting: Family Medicine

## 2021-09-26 ENCOUNTER — Encounter (HOSPITAL_BASED_OUTPATIENT_CLINIC_OR_DEPARTMENT_OTHER): Payer: Self-pay | Admitting: *Deleted

## 2021-09-26 ENCOUNTER — Other Ambulatory Visit: Payer: Self-pay

## 2021-09-26 ENCOUNTER — Emergency Department (HOSPITAL_BASED_OUTPATIENT_CLINIC_OR_DEPARTMENT_OTHER): Payer: BC Managed Care – PPO | Admitting: Radiology

## 2021-09-26 ENCOUNTER — Emergency Department (HOSPITAL_BASED_OUTPATIENT_CLINIC_OR_DEPARTMENT_OTHER)
Admission: EM | Admit: 2021-09-26 | Discharge: 2021-09-26 | Disposition: A | Discharge status: Home / Self Care | Payer: BC Managed Care – PPO | Attending: Student | Admitting: Student

## 2021-09-26 DIAGNOSIS — J45909 Unspecified asthma, uncomplicated: Secondary | ICD-10-CM | POA: Insufficient documentation

## 2021-09-26 DIAGNOSIS — S99911A Unspecified injury of right ankle, initial encounter: Secondary | ICD-10-CM | POA: Diagnosis present

## 2021-09-26 DIAGNOSIS — Y9301 Activity, walking, marching and hiking: Secondary | ICD-10-CM | POA: Insufficient documentation

## 2021-09-26 DIAGNOSIS — W1843XA Slipping, tripping and stumbling without falling due to stepping from one level to another, initial encounter: Secondary | ICD-10-CM | POA: Insufficient documentation

## 2021-09-26 DIAGNOSIS — S92031A Displaced avulsion fracture of tuberosity of right calcaneus, initial encounter for closed fracture: Secondary | ICD-10-CM

## 2021-09-26 DIAGNOSIS — S93401A Sprain of unspecified ligament of right ankle, initial encounter: Secondary | ICD-10-CM

## 2021-09-26 DIAGNOSIS — S9001XA Contusion of right ankle, initial encounter: Secondary | ICD-10-CM | POA: Insufficient documentation

## 2021-09-26 MED ORDER — KETOROLAC TROMETHAMINE 15 MG/ML IJ SOLN
15.0000 mg | Freq: Once | INTRAMUSCULAR | Status: AC
  Administered 2021-09-26: 15 mg via INTRAMUSCULAR
  Filled 2021-09-26: qty 1

## 2021-09-26 NOTE — ED Provider Notes (Signed)
?[facility]PT ?Provider Note ? ? ?CSN: [id number] ?Arrival date & time: 09/26/21  1806 ? ?  ? ?History ? ?Chief Complaint  ?Patient presents with  ? Ankle Pain  ? ? ?Jill Vincent is a 26 y.o. female with pertinent history of celiac disease, asthma, eczema, migraines.  Presents to the emergency department with a chief complaint of right ankle pain. ? ?Patient reports that approximately 1.5 hours prior she was walking and steps when she missed the last step and landed with her foot in an awkward position.  Patient reports that she had pain to lateral aspect of right ankle since this injury.  Patient has had gradually worsening swelling.  Additionally patient endorses numbness to her right fifth toe.  Patient denies hitting her head or any loss of consciousness. ? ?Patient denies any neck pain, back pain, nausea, vomiting, abdominal pain, saddle anesthesia, visual disturbance, color change, pallor, wound. ? ? ?Ankle Pain ?Associated symptoms: no back pain and no neck pain   ? ?  ? ?Home Medications ?Prior to Admission medications   ?Medication Sig Start Date End Date Taking? Authorizing Provider  ?traMADol (ULTRAM) 50 MG tablet Take 1 tablet (50 mg total) by mouth every 6 (six) hours as needed. 04/28/21   Marylene Land, CNM  ?ALPRAZolam (XANAX) 0.25 MG tablet Take 1 tablet (0.25 mg total) by mouth daily as needed. 01/08/19   Everrett Coombe, DO  ?ALPRAZolam (XANAX) 0.5 MG tablet Take one pill per day as needed 04/07/20   [provider]  ?dicyclomine (BENTYL) 10 MG capsule Take 10 mg by mouth 4 (four) times daily -  before meals and at bedtime.    [provider]  ?DULoxetine (CYMBALTA) 30 MG capsule Take 1 capsule (30 mg total) by mouth 3 (three) times daily. 04/27/19 04/01/21  Everrett Coombe, DO  ?levonorgestrel-ethinyl estradiol (LYBREL) 90-20 MCG tablet Take 1 tablet by mouth daily. 02/19/21   Marylene Land, CNM  ?methylphenidate (RITALIN LA) 40  MG 24 hr capsule Take 40 mg by mouth every morning.    [provider]  ?methylphenidate (RITALIN) 10 MG tablet Take 1 tablet by mouth daily in the afternoons as needed 06/25/20   [provider]  ?montelukast (SINGULAIR) 10 MG tablet TAKE 1 TABLET ONCE A DAY/SEASONALLY 10/25/18   Kozlow, Alvira Philips, MD  ?omeprazole (PRILOSEC) 40 MG capsule TAKE 1 CAPSULE BY MOUTH EVERY DAY 03/23/19   Everrett Coombe, DO  ?ondansetron (ZOFRAN) 8 MG tablet Take 1 tab(s) orally 3 times a day, as needed 01/20/20   [provider]  ?QVAR REDIHALER 40 MCG/ACT inhaler TAKE 2 PUFFS BY MOUTH TWICE A DAY 06/03/19   Everrett Coombe, DO  ?rizatriptan (MAXALT) 10 MG tablet TAKE 1 TABLET BY MOUTH EVERY 2 HOURS AS NEEDED. MAX 2 TABS/DAY 01/08/19   Everrett Coombe, DO  ?traZODone (DESYREL) 100 MG tablet Take 2 pills  at night 02/15/20   [provider]  ?VENTOLIN HFA 108 (90 Base) MCG/ACT inhaler USE 2 PUFFS AS NEEDED EVERY 6 HRS 08/06/18   Everrett Coombe, DO  ?   ? ?Allergies    ?Barley grass, Gluten meal, Sulfa antibiotics, and Wheat bran   ? ?Review of Systems   ?Review of Systems  ?HENT:  Negative for facial swelling.   ?Gastrointestinal:  Negative for abdominal pain, nausea and vomiting.  ?Musculoskeletal:  Positive for arthralgias and joint swelling. Negative for back pain, myalgias and neck pain.  ?Skin:  Negative for color change, pallor, rash and  wound.  ?Neurological:  Positive for numbness. Negative for dizziness, syncope, weakness, light-headedness and headaches.  ? ?Physical Exam ?Updated Vital Signs ?BP 117/84 (BP Location: Right Arm)   Pulse (!) 113   Temp 99.3 ?F (37.4 ?C)   Resp 16   SpO2 100%  ?Physical Exam ?Vitals and nursing note reviewed.  ?Constitutional:   ?   General: She is not in acute distress. ?   Appearance: She is not ill-appearing, toxic-appearing or diaphoretic.  ?HENT:  ?   Head: Normocephalic.  ?Eyes:  ?   General: No scleral icterus.    ?   Right eye: No discharge.     ?   Left eye: No  discharge.  ?Cardiovascular:  ?   Rate and Rhythm: Normal rate.  ?   Pulses:     ?     Dorsalis pedis pulses are 2+ on the right side and 2+ on the left side.  ?Pulmonary:  ?   Effort: Pulmonary effort is normal.  ?Musculoskeletal:  ?   Right knee: No swelling, deformity, effusion, erythema, ecchymosis, lacerations, bony tenderness or crepitus. Normal range of motion. No tenderness. Normal alignment.  ?   Left knee: No swelling, deformity, effusion, erythema, ecchymosis, lacerations, bony tenderness or crepitus. Normal range of motion. No tenderness. Normal alignment.  ?   Right lower leg: Normal.  ?   Left lower leg: Normal.  ?   Right ankle: Swelling and ecchymosis present. No deformity or lacerations. Tenderness present over the lateral malleolus. Decreased range of motion.  ?   Left ankle: No swelling, deformity, ecchymosis or lacerations. No tenderness. Normal range of motion.  ?   Right foot: Normal range of motion and normal capillary refill. Swelling, tenderness and bony tenderness present. No deformity or crepitus. Normal pulse.  ?   Left foot: Normal range of motion and normal capillary refill. No swelling, deformity, tenderness, bony tenderness or crepitus. Normal pulse.  ?   Comments: Patient has swelling, tenderness, and ecchymosis to right lateral malleus extending into dorsum of right foot.  Cap refill less than 2 seconds and sensation intact to all digits of bilateral feet.  ?Skin: ?   General: Skin is warm and dry.  ?Neurological:  ?   General: No focal deficit present.  ?   Mental Status: She is alert.  ?Psychiatric:     ?   Behavior: Behavior is cooperative.  ? ? ?ED Results / Procedures / Treatments   ?Labs ?(all labs ordered are listed, but only abnormal results are displayed) ?Labs Reviewed - No data to display ? ?EKG ?None ? ?Radiology ?DG Ankle Complete Right ? ?Result Date: 09/26/2021 ?CLINICAL DATA:  Acute right ankle injury. EXAM: RIGHT ANKLE - COMPLETE 3+ VIEW COMPARISON:  None. FINDINGS:  Small acute avulsion fractures of the lateral calcaneus and dorsal talar head with overlying soft tissue swelling. No additional fracture. The ankle mortise is symmetric. The talar dome is intact. Joint spaces are preserved. Bone mineralization is normal. IMPRESSION: 1. Small acute avulsion fractures of the lateral calcaneus and dorsal talar head. Electronically Signed   By: Obie DredgeWilliam T Derry M.D.   On: 09/26/2021 18:43   ? ?Procedures ?Procedures  ? ? ?Medications Ordered in ED ?Medications - No data to display ? ?ED Course/ Medical Decision Making/ A&P ?  ?                        ?Medical Decision Making ?Amount and/or Complexity of Data  Reviewed ?Radiology: ordered. ? ?Risk ?Prescription drug management. ? ? ?Alert 26 year old female in no acute distress, nontoxic-appearing.  Presents emergency department with a chief complaint of right ankle pain. ? ?Information obtained from patient.  Past medical records reviewed including previous provider notes, labs, and imaging.  Patient has past medical history as outlined in HPI which complicates her care. ? ?Patient has swelling and tenderness to right lateral malleus.  Pulse, motor, and sensation intact distally.  X-ray imaging obtained to evaluate for acute osseous abnormality. ? ?I personally viewed and interpreted patient's x-ray imaging.  Agree with radiology interpretation of small acute avulsion fractures of the lateral calcaneus and dorsal talar head. ? ?Patient given Toradol in the emergency department for her pain.  Patient denies any blood thinner use, history of kidney failure and is adamant that she is not pregnant.  Patient was given information that if she was in her first trimester pregnancy total medication could cause birth defects.  Patient understands this and is agreeable with receiving this medication without pregnancy testing. ? ?Suspect that patient's avulsion fracture secondary to ligamentous injury.  Will place patient in cam walking boot and  make weightbearing with crutches.  Patient given information to follow-up with orthopedic provider in outpatient setting.  Discussed symptomatic treatment with Tylenol, ibuprofen, elevation, and ice. ? ?Based o

## 2021-09-26 NOTE — Discharge Instructions (Addendum)
[facility] today to be evaluated for your right ankle pain after suffering an injury.  Your x-ray showed that you have an evulsion fracture of your lateral calcaneus and dorsal talar head.  This is likely due to an ankle sprain.  Due to this you were placed in a Cam walking boot and given crutches to remain nonweightbearing until you can be followed up by an orthopedic provider.  Please use ibuprofen and Tylenol as outlined below to help with your pain.  Please elevate your leg is much as possible to reduce swelling.  Additionally you may apply ice for 20 minutes at a time, with a 20-minute rest after.   ? ?Please take Ibuprofen (Advil, motrin) and Tylenol (acetaminophen) to relieve your pain.   ? ?You may take up to 600 MG (3 pills) of normal strength ibuprofen every 8 hours as needed.   ?You make take tylenol, up to 1,000 mg (two extra strength pills) every 8 hours as needed.  ? ?It is safe to take ibuprofen and tylenol at the same time as they work differently.  ? Do not take more than 3,000 mg tylenol in a 24 hour period (not more than one dose every 8 hours.  Please check all medication labels as many medications such as pain and cold medications may contain tylenol.  Do not drink alcohol while taking these medications.  Do not take other NSAID'S while taking ibuprofen (such as aleve or naproxen).  Please take ibuprofen with food to decrease stomach upset. ? ? ?Get help right away if: ?Your foot or toes become numb or blue. ?You have severe pain that gets worse. ?

## 2021-09-26 NOTE — ED Triage Notes (Signed)
Patient reports missing steps and landing on right foot wrong. Noticeable swelling below right ankle. Patient reports pain with ambulation. Reports numbness and tingling to toes distal to swelling.  ?

## 2021-10-01 ENCOUNTER — Other Ambulatory Visit (HOSPITAL_BASED_OUTPATIENT_CLINIC_OR_DEPARTMENT_OTHER): Payer: Self-pay | Admitting: Orthopaedic Surgery

## 2021-10-01 ENCOUNTER — Ambulatory Visit (HOSPITAL_BASED_OUTPATIENT_CLINIC_OR_DEPARTMENT_OTHER): Payer: BC Managed Care – PPO | Admitting: Orthopaedic Surgery

## 2021-10-01 ENCOUNTER — Ambulatory Visit (HOSPITAL_BASED_OUTPATIENT_CLINIC_OR_DEPARTMENT_OTHER)
Admission: RE | Admit: 2021-10-01 | Discharge: 2021-10-01 | Disposition: A | Discharge status: Home / Self Care | Payer: BC Managed Care – PPO | Source: Ambulatory Visit | Attending: Orthopaedic Surgery | Admitting: Orthopaedic Surgery

## 2021-10-01 DIAGNOSIS — M25512 Pain in left shoulder: Secondary | ICD-10-CM | POA: Insufficient documentation

## 2021-10-01 DIAGNOSIS — M25571 Pain in right ankle and joints of right foot: Secondary | ICD-10-CM

## 2021-10-01 NOTE — Progress Notes (Signed)
? ?                            ? ? ?Chief Complaint: Right foot pain left shoulder pain ?  ? ? ?History of Present Illness:  ? ? ?Jill Vincent is a 26 y.o. female right-hand-dominant presents with right foot pain after a fall down several steps 1 week prior.  She initially presented to the emergency room and is being weightbearing as tolerated in the cam boot.  She was told that she had a sprain.  She follows up today for further assessment.  She continues to have pain swelling and bruising in the right foot with inability to bear significant weight on the forefoot.  She has been taking Mobic and Toradol which do help somewhat.  She does have left shoulder pain centered around the proximal humerus.  She has full range of motion although this is somewhat sore. ? ? ? ?Surgical History:   ?None ? ?PMH/PSH/Family History/Social History/Meds/Allergies:   ? ?Past Medical History:  ?Diagnosis Date  ? Asthma   ? Celiac disease   ? Eczema   ? Ovarian cyst   ? ?Past Surgical History:  ?Procedure Laterality Date  ? ADENOIDECTOMY    ? COLONOSCOPY  10/2020  ? TONSILLECTOMY    ? UPPER GI ENDOSCOPY  06/27/2017  ? ?Social History  ? ?Socioeconomic History  ? Marital status: Single  ?  Spouse name: Not on file  ? Number of children: Not on file  ? Years of education: Not on file  ? Highest education level: Not on file  ?Occupational History  ? Not on file  ?Tobacco Use  ? Smoking status: Never  ? Smokeless tobacco: Never  ?Vaping Use  ? Vaping Use: Never used  ?Substance and Sexual Activity  ? Alcohol use: Yes  ?  Comment: social   ? Drug use: Never  ? Sexual activity: Not Currently  ?  Birth control/protection: Pill  ?Other Topics Concern  ? Not on file  ?Social History Narrative  ? Not on file  ? ?Social Determinants of Health  ? ?Financial Resource Strain: Not on file  ?Food Insecurity: Not on file  ?Transportation Needs: Not on file  ?Physical Activity: Not on file  ?Stress: Not on file  ?Social Connections: Not on file   ? ?Family History  ?Problem Relation Age of Onset  ? Allergic rhinitis Other   ? Eczema Sister   ? Hyperthyroidism Sister   ? Graves' disease Sister   ? Hypertension Father   ? Diabetes Paternal Uncle   ? Hypertension Maternal Grandmother   ? Cancer Maternal Grandmother   ? Kidney cancer Maternal Grandmother   ? Breast cancer Maternal Grandmother   ? Heart disease Maternal Grandfather   ? Stroke Maternal Grandfather   ? Cancer Maternal Grandfather   ? Lung cancer Maternal Grandfather   ? Cancer Paternal Grandmother   ? Lung cancer Paternal Grandmother   ? Cancer Paternal Grandfather   ? Lung cancer Paternal Grandfather   ? Urticaria Neg Hx   ? ?Allergies  ?Allergen Reactions  ? Barley Grass   ?  Other reaction(s): GI Upset (intolerance)  ? Gluten Meal Other (See Comments)  ? Sulfa Antibiotics Itching  ? Wheat Bran   ? ?Current Outpatient Medications  ?Medication Sig Dispense Refill  ? traMADol (ULTRAM) 50 MG tablet Take 1 tablet (50 mg total) by mouth every 6 (six) hours as needed. 15  tablet 0  ? ALPRAZolam (XANAX) 0.25 MG tablet Take 1 tablet (0.25 mg total) by mouth daily as needed. 30 tablet 1  ? ALPRAZolam (XANAX) 0.5 MG tablet Take one pill per day as needed    ? dicyclomine (BENTYL) 10 MG capsule Take 10 mg by mouth 4 (four) times daily -  before meals and at bedtime.    ? DULoxetine (CYMBALTA) 30 MG capsule Take 1 capsule (30 mg total) by mouth 3 (three) times daily. 270 capsule 2  ? levonorgestrel-ethinyl estradiol (LYBREL) 90-20 MCG tablet Take 1 tablet by mouth daily. 28 tablet 11  ? methylphenidate (RITALIN LA) 40 MG 24 hr capsule Take 40 mg by mouth every morning.    ? methylphenidate (RITALIN) 10 MG tablet Take 1 tablet by mouth daily in the afternoons as needed    ? montelukast (SINGULAIR) 10 MG tablet TAKE 1 TABLET ONCE A DAY/SEASONALLY 90 tablet 1  ? omeprazole (PRILOSEC) 40 MG capsule TAKE 1 CAPSULE BY MOUTH EVERY DAY 90 capsule 1  ? ondansetron (ZOFRAN) 8 MG tablet Take 1 tab(s) orally 3 times a  day, as needed    ? QVAR REDIHALER 40 MCG/ACT inhaler TAKE 2 PUFFS BY MOUTH TWICE A DAY 30 g 1  ? rizatriptan (MAXALT) 10 MG tablet TAKE 1 TABLET BY MOUTH EVERY 2 HOURS AS NEEDED. MAX 2 TABS/DAY 10 tablet 3  ? traZODone (DESYREL) 100 MG tablet Take 2 pills  at night    ? VENTOLIN HFA 108 (90 Base) MCG/ACT inhaler USE 2 PUFFS AS NEEDED EVERY 6 HRS 18 Inhaler 1  ? ?No current facility-administered medications for this visit.  ? ?No results found. ? ?Review of Systems:   ?A ROS was performed including pertinent positives and negatives as documented in the HPI. ? ?Physical Exam :   ?Constitutional: NAD and appears stated age ?Neurological: Alert and oriented ?Psych: Appropriate affect and cooperative ?There were no vitals taken for this visit.  ? ?Comprehensive Musculoskeletal Exam:   ? ?Tenderness palpation about the proximal humeral metaphysis.  He has full forward elevation to 170 degrees actively external rotation at side is to 70 degrees internal rotation L1 bilaterally this equal to contralateral side full strength with some soreness. ? ?With regard to her right foot she has tenderness about the midfoot.  She has inability to bear weight on this.  There is significant bruising about the dorsal and plantar aspect of the foot.  She has minimal tenderness about the ATFL. ? ?Imaging:   ?Xray (3 views left shoulder, 3 views right foot, 3 views right ankle): ?Normal ? ? ?I personally reviewed and interpreted the radiographs. ? ? ?Assessment:   ?26 y.o. female with left shoulder pain consistent with bony contusion.  With regard to this may continue to be activity as premature as needed.  With regard to her right foot given the fact that she does have significant swelling about the midfoot and inability to bear weight, I am concerned for possible Lisfranc injury.  Unfortunately she is not able to weight-bear on the right foot so weightbearing views are somewhat less helpful.  Given this and given her young age and  activity level, I do believe an MRI is indicated to rule out an acute ligamentous Lisfranc injury.  We will plan to do this and have her follow-up to discuss results ? ?Plan :   ? ?-Plan for MRI right foot to rule out Lisfranc injury ? ? ?I believe that advance imaging in the form of  an MRI is indicated for the following reasons: ?-Xrays images were obtained and not diagnostic ?-The patient has failed treatment modalities including rest, activity restriction, NSAIDs, boot ?-The following worrisome symptoms are present on history and exam: Positive tenderness about the midfoot with plantar ecchymosis ? ? ? ? ? ? ?I personally saw and evaluated the patient, and participated in the management and treatment plan. ? ?Huel Cote, MD ?Attending Physician, Orthopedic Surgery ? ?This document was dictated using Conservation officer, historic buildings. A reasonable attempt at proof reading has been made to minimize errors. ?

## 2021-10-02 ENCOUNTER — Telehealth (HOSPITAL_BASED_OUTPATIENT_CLINIC_OR_DEPARTMENT_OTHER): Payer: Self-pay

## 2021-10-05 ENCOUNTER — Encounter (HOSPITAL_BASED_OUTPATIENT_CLINIC_OR_DEPARTMENT_OTHER): Payer: Self-pay | Admitting: Orthopaedic Surgery

## 2021-10-05 ENCOUNTER — Emergency Department (HOSPITAL_COMMUNITY)
Admission: EM | Admit: 2021-10-05 | Discharge: 2021-10-05 | Discharge status: Against Medical Advice | Payer: BC Managed Care – PPO

## 2021-10-05 ENCOUNTER — Other Ambulatory Visit: Payer: Self-pay

## 2021-10-05 NOTE — ED Notes (Signed)
Patient left without being seen.

## 2021-10-07 ENCOUNTER — Ambulatory Visit
Admission: RE | Admit: 2021-10-07 | Discharge: 2021-10-07 | Disposition: A | Discharge status: Home / Self Care | Payer: BC Managed Care – PPO | Source: Ambulatory Visit | Attending: Orthopaedic Surgery | Admitting: Orthopaedic Surgery

## 2021-10-07 DIAGNOSIS — M25571 Pain in right ankle and joints of right foot: Secondary | ICD-10-CM

## 2021-10-08 ENCOUNTER — Ambulatory Visit (HOSPITAL_BASED_OUTPATIENT_CLINIC_OR_DEPARTMENT_OTHER): Payer: BC Managed Care – PPO | Admitting: Orthopaedic Surgery

## 2021-10-08 DIAGNOSIS — S92214A Nondisplaced fracture of cuboid bone of right foot, initial encounter for closed fracture: Secondary | ICD-10-CM | POA: Diagnosis not present

## 2021-10-08 NOTE — Progress Notes (Signed)
? ?                            ? ? ?Chief Complaint: Right foot pain left shoulder pain ?  ? ? ?History of Present Illness:  ? ?10/08/2021: Presents today for MRI follow-up of her right foot.  She has been continuing to note increased swelling and pain with weightbearing.  She has been trying to keep weight off of this.  She has been using a series of crutches as well as a knee scooter. ? ?Jill MillinerMargaret Vincent is a 26 y.o. female presents with right foot pain after a fall down several steps 1 week prior.  She initially presented to the emergency room and is being weightbearing as tolerated in the cam boot.  She was told that she had a sprain.  She follows up today for further assessment.  She continues to have pain swelling and bruising in the right foot with inability to bear significant weight on the forefoot.  She has been taking Mobic and Toradol which do help somewhat.  She does have left shoulder pain centered around the proximal humerus.  She has full range of motion although this is somewhat sore. ? ? ? ?Surgical History:   ?None ? ?PMH/PSH/Family History/Social History/Meds/Allergies:   ? ?Past Medical History:  ?Diagnosis Date  ? Asthma   ? Celiac disease   ? Eczema   ? Ovarian cyst   ? ?Past Surgical History:  ?Procedure Laterality Date  ? ADENOIDECTOMY    ? COLONOSCOPY  10/2020  ? TONSILLECTOMY    ? UPPER GI ENDOSCOPY  06/27/2017  ? ?Social History  ? ?Socioeconomic History  ? Marital status: Single  ?  Spouse name: Not on file  ? Number of children: Not on file  ? Years of education: Not on file  ? Highest education level: Not on file  ?Occupational History  ? Not on file  ?Tobacco Use  ? Smoking status: Never  ? Smokeless tobacco: Never  ?Vaping Use  ? Vaping Use: Never used  ?Substance and Sexual Activity  ? Alcohol use: Yes  ?  Comment: social   ? Drug use: Never  ? Sexual activity: Not Currently  ?  Birth control/protection: Pill  ?Other Topics Concern  ? Not on file  ?Social History Narrative  ? Not on  file  ? ?Social Determinants of Health  ? ?Financial Resource Strain: Not on file  ?Food Insecurity: Not on file  ?Transportation Needs: Not on file  ?Physical Activity: Not on file  ?Stress: Not on file  ?Social Connections: Not on file  ? ?Family History  ?Problem Relation Age of Onset  ? Allergic rhinitis Other   ? Eczema Sister   ? Hyperthyroidism Sister   ? Graves' disease Sister   ? Hypertension Father   ? Diabetes Paternal Uncle   ? Hypertension Maternal Grandmother   ? Cancer Maternal Grandmother   ? Kidney cancer Maternal Grandmother   ? Breast cancer Maternal Grandmother   ? Heart disease Maternal Grandfather   ? Stroke Maternal Grandfather   ? Cancer Maternal Grandfather   ? Lung cancer Maternal Grandfather   ? Cancer Paternal Grandmother   ? Lung cancer Paternal Grandmother   ? Cancer Paternal Grandfather   ? Lung cancer Paternal Grandfather   ? Urticaria Neg Hx   ? ?Allergies  ?Allergen Reactions  ? Barley Grass   ?  Other reaction(s): GI Upset (intolerance)  ?  Gluten Meal Other (See Comments)  ? Sulfa Antibiotics Itching  ? Wheat Bran   ? ?Current Outpatient Medications  ?Medication Sig Dispense Refill  ? traMADol (ULTRAM) 50 MG tablet Take 1 tablet (50 mg total) by mouth every 6 (six) hours as needed. 15 tablet 0  ? ALPRAZolam (XANAX) 0.25 MG tablet Take 1 tablet (0.25 mg total) by mouth daily as needed. 30 tablet 1  ? ALPRAZolam (XANAX) 0.5 MG tablet Take one pill per day as needed    ? dicyclomine (BENTYL) 10 MG capsule Take 10 mg by mouth 4 (four) times daily -  before meals and at bedtime.    ? DULoxetine (CYMBALTA) 30 MG capsule Take 1 capsule (30 mg total) by mouth 3 (three) times daily. 270 capsule 2  ? levonorgestrel-ethinyl estradiol (LYBREL) 90-20 MCG tablet Take 1 tablet by mouth daily. 28 tablet 11  ? methylphenidate (RITALIN LA) 40 MG 24 hr capsule Take 40 mg by mouth every morning.    ? methylphenidate (RITALIN) 10 MG tablet Take 1 tablet by mouth daily in the afternoons as needed    ?  montelukast (SINGULAIR) 10 MG tablet TAKE 1 TABLET ONCE A DAY/SEASONALLY 90 tablet 1  ? omeprazole (PRILOSEC) 40 MG capsule TAKE 1 CAPSULE BY MOUTH EVERY DAY 90 capsule 1  ? ondansetron (ZOFRAN) 8 MG tablet Take 1 tab(s) orally 3 times a day, as needed    ? QVAR REDIHALER 40 MCG/ACT inhaler TAKE 2 PUFFS BY MOUTH TWICE A DAY 30 g 1  ? rizatriptan (MAXALT) 10 MG tablet TAKE 1 TABLET BY MOUTH EVERY 2 HOURS AS NEEDED. MAX 2 TABS/DAY 10 tablet 3  ? traZODone (DESYREL) 100 MG tablet Take 2 pills  at night    ? VENTOLIN HFA 108 (90 Base) MCG/ACT inhaler USE 2 PUFFS AS NEEDED EVERY 6 HRS 18 Inhaler 1  ? ?No current facility-administered medications for this visit.  ? ?MR FOOT RIGHT WO CONTRAST ? ?Result Date: 10/07/2021 ?CLINICAL DATA:  Foot trauma. Lisfranc injury suspected. X-ray equivocal. Pain, swelling, and weakness. Numbness in toes for 1.5 weeks due to missing a step. EXAM: MRI OF THE RIGHT FOREFOOT WITHOUT CONTRAST TECHNIQUE: Multiplanar, multisequence MR imaging of the right forefoot was performed. No intravenous contrast was administered. COMPARISON:  right foot radiographs (weight-bearing) 10/01/2021, right ankle radiographs 09/26/2021 FINDINGS: Bones/Joint/Cartilage Mild great toe metatarsophalangeal joint space narrowing, cartilage thinning, and peripheral degenerative spurring. There is marrow edema seen within the far dorsal medial aspect of the cuboid. There appears to be a nondisplaced likely subacute fracture line in this region (coronal series 7, image 17). Ligaments The dorsal, interosseous, and plantar aspect of the Lisfranc ligament complex appear intact (axial series 5 images 10 through 13). The plantar plates appear intact. Muscles and Tendons Mild edema within the dorsal midfoot extensor digitorum brevis muscle body. No tendon tear is seen. Soft tissues Mild-to-moderate edema and swelling of the dorsal lateral midfoot subcutaneous fat. IMPRESSION:: IMPRESSION: 1. Note is made of moderate lateral  midfoot to hindfoot soft tissue swelling on 09/26/2021 ankle radiographs in the region of the lateral calcaneus and cuboid. On the current MRI, there is moderate lateral and dorsal midfoot subcutaneous fat swelling and edema, and there is moderate marrow edema within the dorsal medial aspect of the cuboid surrounding a probable tiny nondisplaced subacute fracture. 2. The Lisfranc ligament complex is intact. Electronically Signed   By: Neita Garnet M.D.   On: 10/07/2021 18:10   ? ?Review of Systems:   ?A ROS  was performed including pertinent positives and negatives as documented in the HPI. ? ?Physical Exam :   ?Constitutional: NAD and appears stated age ?Neurological: Alert and oriented ?Psych: Appropriate affect and cooperative ?There were no vitals taken for this visit.  ? ?Comprehensive Musculoskeletal Exam:   ? ? ?With regard to her right foot she has tenderness about the cuboid laterally.  She has inability to bear weight on this.  There is significant bruising about the dorsal and plantar aspect of the foot.   ?Imaging:   ?Xray (3 views left shoulder, 3 views right foot, 3 views right ankle): ?Normal ? ?MRI right foot: ?Nondisplaced fracture of the cuboid with intact Lisfranc complex ? ?I personally reviewed and interpreted the radiographs. ? ? ?Assessment:   ?26 y.o. female with a right foot nondisplaced cuboid fracture.  At this time I have advised her on specific swelling and bruising that comes with these types of fractures.  I advised that while she may begin to start placing weight on the foot and weight-bear as tolerated that this will likely require multiple weeks of additional elevation and ice.  She understands this.  I will see her back in 3 weeks for reassessment. ? ?Plan :   ? ?-Return to clinic in 3 weeks ? ? ? ? ? ? ?I personally saw and evaluated the patient, and participated in the management and treatment plan. ? ?Huel Cote, MD ?Attending Physician, Orthopedic Surgery ? ?This document  was dictated using Conservation officer, historic buildings. A reasonable attempt at proof reading has been made to minimize errors. ?

## 2021-10-22 ENCOUNTER — Encounter (HOSPITAL_BASED_OUTPATIENT_CLINIC_OR_DEPARTMENT_OTHER): Payer: Self-pay | Admitting: Orthopaedic Surgery

## 2021-10-26 ENCOUNTER — Ambulatory Visit (HOSPITAL_BASED_OUTPATIENT_CLINIC_OR_DEPARTMENT_OTHER): Payer: BC Managed Care – PPO | Admitting: Orthopaedic Surgery

## 2021-10-26 ENCOUNTER — Encounter (HOSPITAL_BASED_OUTPATIENT_CLINIC_OR_DEPARTMENT_OTHER): Payer: Self-pay

## 2021-12-28 ENCOUNTER — Ambulatory Visit: Payer: BC Managed Care – PPO | Admitting: Obstetrics & Gynecology

## 2022-01-21 ENCOUNTER — Telehealth: Payer: Self-pay

## 2022-01-21 NOTE — Telephone Encounter (Signed)
Fax communication received from pharmacy that patient needs refill of birth control pills. Called pt to follow up; VM left requesting call back to schedule annual and discuss refill.

## 2022-03-17 ENCOUNTER — Encounter: Payer: Self-pay | Admitting: Advanced Practice Midwife

## 2022-03-17 ENCOUNTER — Other Ambulatory Visit: Payer: Self-pay | Admitting: *Deleted

## 2022-03-17 MED ORDER — LEVONORGESTREL-ETHINYL ESTRAD 90-20 MCG PO TABS: 1.0000 | ORAL_TABLET | Freq: Every day | ORAL | 1 refills | Status: DC

## 2022-03-22 DIAGNOSIS — E282 Polycystic ovarian syndrome: Secondary | ICD-10-CM | POA: Insufficient documentation

## 2022-03-22 DIAGNOSIS — E782 Mixed hyperlipidemia: Secondary | ICD-10-CM | POA: Insufficient documentation

## 2022-03-22 DIAGNOSIS — E88819 Insulin resistance, unspecified: Secondary | ICD-10-CM | POA: Insufficient documentation

## 2022-03-24 ENCOUNTER — Ambulatory Visit: Payer: BC Managed Care – PPO | Admitting: Obstetrics & Gynecology

## 2022-04-05 ENCOUNTER — Ambulatory Visit (INDEPENDENT_AMBULATORY_CARE_PROVIDER_SITE_OTHER): Payer: BC Managed Care – PPO | Admitting: Advanced Practice Midwife

## 2022-04-05 ENCOUNTER — Other Ambulatory Visit: Payer: Self-pay

## 2022-04-05 ENCOUNTER — Other Ambulatory Visit (HOSPITAL_COMMUNITY)
Admission: RE | Admit: 2022-04-05 | Discharge: 2022-04-05 | Disposition: A | Discharge status: Home / Self Care | Payer: BC Managed Care – PPO | Source: Ambulatory Visit | Attending: Obstetrics & Gynecology | Admitting: Obstetrics & Gynecology

## 2022-04-05 ENCOUNTER — Encounter: Payer: Self-pay | Admitting: Advanced Practice Midwife

## 2022-04-05 VITALS — BP 125/97 | HR 96 | Ht 64.0 in | Wt 197.8 lb

## 2022-04-05 DIAGNOSIS — R87810 Cervical high risk human papillomavirus (HPV) DNA test positive: Secondary | ICD-10-CM | POA: Insufficient documentation

## 2022-04-05 DIAGNOSIS — Z124 Encounter for screening for malignant neoplasm of cervix: Secondary | ICD-10-CM

## 2022-04-05 DIAGNOSIS — Z23 Encounter for immunization: Secondary | ICD-10-CM

## 2022-04-05 DIAGNOSIS — Z113 Encounter for screening for infections with a predominantly sexual mode of transmission: Secondary | ICD-10-CM

## 2022-04-05 DIAGNOSIS — R87610 Atypical squamous cells of undetermined significance on cytologic smear of cervix (ASC-US): Secondary | ICD-10-CM

## 2022-04-05 DIAGNOSIS — Z7185 Encounter for immunization safety counseling: Secondary | ICD-10-CM

## 2022-04-05 DIAGNOSIS — D069 Carcinoma in situ of cervix, unspecified: Secondary | ICD-10-CM

## 2022-04-05 DIAGNOSIS — Z01419 Encounter for gynecological examination (general) (routine) without abnormal findings: Secondary | ICD-10-CM

## 2022-04-05 DIAGNOSIS — Z3041 Encounter for surveillance of contraceptive pills: Secondary | ICD-10-CM

## 2022-04-05 DIAGNOSIS — Z114 Encounter for screening for human immunodeficiency virus [HIV]: Secondary | ICD-10-CM

## 2022-04-05 MED ORDER — LEVONORGESTREL-ETHINYL ESTRAD 90-20 MCG PO TABS: 1.0000 | ORAL_TABLET | Freq: Every day | ORAL | 12 refills | Status: DC

## 2022-04-05 NOTE — Progress Notes (Signed)
GYNECOLOGY ANNUAL PREVENTATIVE CARE ENCOUNTER NOTE  History:     Jill Vincent is a 26 y.o.  female here for a routine annual gynecologic exam.  Current complaints: none. Requests STI testing and wishes to continue OPCs.  Denies abnormal vaginal bleeding, discharge, pelvic pain, problems with intercourse or other gynecologic concerns.    Gynecologic History No LMP recorded. (Menstrual status: Irregular Periods). Contraception: OCP (estrogen/progesterone) Last Pap: 2022. Results were: ASCUS with pos HR HPV> colposcopy>LEEP CIN 3 Last mammogram: None. Results were: NA  Obstetric History OB History  No obstetric history on file.    Past Medical History:  Diagnosis Date   Asthma    Celiac disease    Eczema    Ovarian cyst     Past Surgical History:  Procedure Laterality Date   ADENOIDECTOMY     COLONOSCOPY  10/2020   TONSILLECTOMY     UPPER GI ENDOSCOPY  06/27/2017    Current Outpatient Medications on File Prior to Visit  Medication Sig Dispense Refill   ALPRAZolam (XANAX) 0.5 MG tablet Take one pill per day as needed     dicyclomine (BENTYL) 10 MG capsule Take 10 mg by mouth 4 (four) times daily -  before meals and at bedtime.     levonorgestrel-ethinyl estradiol (LYBREL) 90-20 MCG tablet Take 1 tablet by mouth daily. 28 tablet 1   methylphenidate (RITALIN LA) 40 MG 24 hr capsule Take 40 mg by mouth every morning.     methylphenidate (RITALIN) 10 MG tablet Take 1 tablet by mouth daily in the afternoons as needed     omeprazole (PRILOSEC) 40 MG capsule TAKE 1 CAPSULE BY MOUTH EVERY DAY 90 capsule 1   ondansetron (ZOFRAN) 8 MG tablet Take 1 tab(s) orally 3 times a day, as needed     QVAR REDIHALER 40 MCG/ACT inhaler TAKE 2 PUFFS BY MOUTH TWICE A DAY 30 g 1   rizatriptan (MAXALT) 10 MG tablet TAKE 1 TABLET BY MOUTH EVERY 2 HOURS AS NEEDED. MAX 2 TABS/DAY 10 tablet 3   traMADol (ULTRAM) 50 MG tablet Take 1 tablet (50 mg total) by mouth every 6 (six) hours as needed. 15  tablet 0   VENTOLIN HFA 108 (90 Base) MCG/ACT inhaler USE 2 PUFFS AS NEEDED EVERY 6 HRS 18 Inhaler 1   ALPRAZolam (XANAX) 0.25 MG tablet Take 1 tablet (0.25 mg total) by mouth daily as needed. 30 tablet 1   DULoxetine (CYMBALTA) 30 MG capsule Take 1 capsule (30 mg total) by mouth 3 (three) times daily. 270 capsule 2   montelukast (SINGULAIR) 10 MG tablet TAKE 1 TABLET ONCE A DAY/SEASONALLY 90 tablet 1   traZODone (DESYREL) 100 MG tablet Take 2 pills  at night     No current facility-administered medications on file prior to visit.    Allergies  Allergen Reactions   Barley Grass     Other reaction(s): GI Upset (intolerance)   Gluten Meal Other (See Comments)   Sulfa Antibiotics Itching   Wheat Bran     Social History:  reports that she has never smoked. She has never used smokeless tobacco. She reports current alcohol use. She reports that she does not use drugs.  Family History  Problem Relation Age of Onset   Allergic rhinitis Other    Eczema Sister    Hyperthyroidism Sister    Berenice Primas' disease Sister    Hypertension Father    Diabetes Paternal Uncle    Hypertension Maternal Grandmother    Cancer Maternal  Grandmother    Kidney cancer Maternal Grandmother    Breast cancer Maternal Grandmother    Heart disease Maternal Grandfather    Stroke Maternal Grandfather    Cancer Maternal Grandfather    Lung cancer Maternal Grandfather    Cancer Paternal Grandmother    Lung cancer Paternal Grandmother    Cancer Paternal Grandfather    Lung cancer Paternal Grandfather    Urticaria Neg Hx     The following portions of the patient's history were reviewed and updated as appropriate: allergies, current medications, past family history, past medical history, past social history, past surgical history and problem list.  Review of Systems Review of Systems  Constitutional:  Negative for chills and fatigue.  Gastrointestinal:  Negative for abdominal pain.  Genitourinary:  Negative for  dyspareunia, dysuria, vaginal bleeding and vaginal discharge.      Physical Exam:  BP (!) 125/97   Pulse 96   Ht 5\' 4"  (1.626 m)   Wt 197 lb 12.8 oz (89.7 kg)   BMI 33.95 kg/m  CONSTITUTIONAL: Well-developed, well-nourished female in no acute distress.  HENT:  Normocephalic, atraumatic, Oropharynx is clear and moist EYES: Conjunctivae normal. No scleral icterus.  SKIN: Skin is warm and dry. No rash noted. Not diaphoretic. No erythema. No pallor. MUSCULOSKELETAL: Normal range of motion. No tenderness.  No cyanosis, clubbing, or edema.   NEUROLOGIC: Alert and oriented to person, place, and time. Normal muscle tone coordination.  PSYCHIATRIC: Normal mood and affect. Normal behavior. Normal judgment and thought content. CARDIOVASCULAR: Normal heart rate noted. RESPIRATORY:  Effort and rate normal, no problems with respiration noted. BREASTS: Deferred ABDOMEN: Soft, normal bowel sounds, no distention noted.  No tenderness, rebound or guarding.  PELVIC: Normal appearing external genitalia; normal appearing vaginal mucosa and cervix.  No abnormal discharge noted.  Pap smear obtained. Cervical appearance C/W Hx excisional procedure. Normal uterine size, no other palpable masses, no uterine or adnexal tenderness.   Assessment and Plan:    1. Well woman exam with routine gynecological exam  - Cytology - PAP( [place]) - HIV Antibody (routine testing w rflx)  2. Encounter for Papanicolaou smear for cervical cancer screening  - Cytology - PAP( [place]) - RPR - Hepatitis B Surface AntiGEN  3. ASCUS with positive high risk HPV cervical  - Cytology - PAP( [place])  4. Routine screening for STI (sexually transmitted infection)  - HIV Antibody (routine testing w rflx) - RPR - Hepatitis B Surface AntiGEN  5. Encounter for screening for HIV  - HIV Antibody (routine testing w rflx)  6. CIN III (cervical intraepithelial neoplasia III)  - Cytology - PAP( )  Will  follow up results of pap smear and manage accordingly. Mammogram not indicated Reccommed condoms for STI prevention Routine preventative health maintenance measures emphasized. Please refer to After Visit Summary for other counseling recommendations.      , CNM [facility], [facility]

## 2022-04-06 LAB — HEPATITIS B SURFACE ANTIGEN: Hepatitis B Surface Ag: NEGATIVE

## 2022-04-06 LAB — HIV ANTIBODY (ROUTINE TESTING W REFLEX): HIV Screen 4th Generation wRfx: NONREACTIVE

## 2022-04-06 LAB — RPR: RPR Ser Ql: NONREACTIVE

## 2022-04-07 LAB — CYTOLOGY - PAP
Comment: NEGATIVE
Diagnosis: NEGATIVE
Diagnosis: REACTIVE
High risk HPV: NEGATIVE

## 2022-04-09 ENCOUNTER — Encounter: Payer: Self-pay | Admitting: Advanced Practice Midwife

## 2022-08-09 ENCOUNTER — Ambulatory Visit: Payer: BC Managed Care – PPO

## 2022-09-20 ENCOUNTER — Other Ambulatory Visit: Payer: Self-pay

## 2022-09-20 ENCOUNTER — Emergency Department (HOSPITAL_COMMUNITY)
Admission: EM | Admit: 2022-09-20 | Discharge: 2022-09-20 | Disposition: A | Discharge status: Home / Self Care | Payer: BC Managed Care – PPO | Attending: Emergency Medicine | Admitting: Emergency Medicine

## 2022-09-20 DIAGNOSIS — R109 Unspecified abdominal pain: Secondary | ICD-10-CM | POA: Diagnosis present

## 2022-09-20 DIAGNOSIS — R1084 Generalized abdominal pain: Secondary | ICD-10-CM | POA: Insufficient documentation

## 2022-09-20 DIAGNOSIS — R197 Diarrhea, unspecified: Secondary | ICD-10-CM | POA: Diagnosis not present

## 2022-09-20 DIAGNOSIS — R112 Nausea with vomiting, unspecified: Secondary | ICD-10-CM | POA: Diagnosis not present

## 2022-09-20 LAB — COMPREHENSIVE METABOLIC PANEL
ALT: 30 U/L (ref 0–44)
AST: 23 U/L (ref 15–41)
Albumin: 3.6 g/dL (ref 3.5–5.0)
Alkaline Phosphatase: 47 U/L (ref 38–126)
Anion gap: 13 (ref 5–15)
BUN: 12 mg/dL (ref 6–20)
CO2: 22 mmol/L (ref 22–32)
Calcium: 8.9 mg/dL (ref 8.9–10.3)
Chloride: 102 mmol/L (ref 98–111)
Creatinine, Ser: 0.9 mg/dL (ref 0.44–1.00)
GFR, Estimated: >60 mL/min (ref >60)
Glucose, Bld: 125 mg/dL — ABNORMAL HIGH (ref 70–99)
Potassium: 3.8 mmol/L (ref 3.5–5.1)
Sodium: 137 mmol/L (ref 135–145)
Total Bilirubin: 0.9 mg/dL (ref 0.3–1.2)
Total Protein: 7.1 g/dL (ref 6.5–8.1)

## 2022-09-20 LAB — URINALYSIS, ROUTINE W REFLEX MICROSCOPIC
Bilirubin Urine: NEGATIVE
Glucose, UA: NEGATIVE mg/dL
Hgb urine dipstick: NEGATIVE
Ketones, ur: 20 mg/dL — AB
Leukocytes,Ua: NEGATIVE
Nitrite: NEGATIVE
Protein, ur: NEGATIVE mg/dL
Specific Gravity, Urine: 1.025 (ref 1.005–1.030)
pH: 5 (ref 5.0–8.0)

## 2022-09-20 LAB — LIPASE, BLOOD: Lipase: 29 U/L (ref 11–51)

## 2022-09-20 LAB — CBC
HCT: 46 % (ref 36.0–46.0)
Hemoglobin: 15 g/dL (ref 12.0–15.0)
MCH: 29.4 pg (ref 26.0–34.0)
MCHC: 32.6 g/dL (ref 30.0–36.0)
MCV: 90.2 fL (ref 80.0–100.0)
Platelets: 310 10*3/uL (ref 150–400)
RBC: 5.1 MIL/uL (ref 3.87–5.11)
RDW: 12.8 % (ref 11.5–15.5)
WBC: 14.7 10*3/uL — ABNORMAL HIGH (ref 4.0–10.5)
nRBC: 0 % (ref 0.0–0.2)

## 2022-09-20 LAB — I-STAT BETA HCG BLOOD, ED (MC, WL, AP ONLY): I-stat hCG, quantitative: <5 m[IU]/mL (ref <5)

## 2022-09-20 MED ORDER — DICYCLOMINE HCL 20 MG PO TABS: 20.0000 mg | ORAL_TABLET | Freq: Two times a day (BID) | ORAL | 0 refills | Status: AC

## 2022-09-20 MED ORDER — ONDANSETRON HCL 4 MG/2ML IJ SOLN
4.0000 mg | Freq: Once | INTRAMUSCULAR | Status: AC
  Administered 2022-09-20: 4 mg via INTRAVENOUS
  Filled 2022-09-20: qty 2

## 2022-09-20 MED ORDER — ONDANSETRON 4 MG PO TBDP: 4.0000 mg | ORAL_TABLET | Freq: Three times a day (TID) | ORAL | 0 refills | Status: AC | PRN

## 2022-09-20 MED ORDER — DICYCLOMINE HCL 10 MG PO CAPS
20.0000 mg | ORAL_CAPSULE | Freq: Once | ORAL | Status: AC
  Administered 2022-09-20: 20 mg via ORAL
  Filled 2022-09-20: qty 2

## 2022-09-20 MED ORDER — SODIUM CHLORIDE 0.9 % IV BOLUS
1000.0000 mL | Freq: Once | INTRAVENOUS | Status: AC
  Administered 2022-09-20: 1000 mL via INTRAVENOUS

## 2022-09-20 MED ORDER — KETOROLAC TROMETHAMINE 30 MG/ML IJ SOLN
30.0000 mg | Freq: Once | INTRAMUSCULAR | Status: AC
  Administered 2022-09-20: 30 mg via INTRAVENOUS
  Filled 2022-09-20: qty 1

## 2022-09-20 NOTE — ED Provider Notes (Signed)
Jill Vincent EMERGENCY DEPARTMENT AT [facility] Provider Note   CSN: [id number] Arrival date & time: 09/20/22  [id number]     History  Chief Complaint  Patient presents with   Abdominal Pain    Jill Vincent is a 27 y.o. female.  The history is provided by the patient and medical records.  Abdominal Pain Associated symptoms: diarrhea, nausea and vomiting    27 y.o. F with hx of celiac disease, presenting to the ED with abdominal pain.  Patient states she ate chipoltle for dinner, woke up around midnight with diarrhea and lower abdominal pain.  States she felt like it may have been cross contamination with her food which caused it.  She states she had several loose stools, emesis x2 and "cold sweats".  She denies any fever.  No sick contacts with similar.  No meds PTA.  Home Medications Prior to Admission medications   Medication Sig Start Date End Date Taking? Authorizing Provider  ALPRAZolam Prudy Feeler) 0.5 MG tablet Take one pill per day as needed 04/07/20   [provider]  dicyclomine (BENTYL) 10 MG capsule Take 10 mg by mouth 4 (four) times daily -  before meals and at bedtime.    [provider]  DULoxetine (CYMBALTA) 30 MG capsule Take 1 capsule (30 mg total) by mouth 3 (three) times daily. 04/27/19 04/01/21  Everrett Coombe, DO  levonorgestrel-ethinyl estradiol (LYBREL) 90-20 MCG tablet Take 1 tablet by mouth daily. 04/05/22   Katrinka Blazing, [place], CNM  methylphenidate (RITALIN LA) 40 MG 24 hr capsule Take 40 mg by mouth every morning.    [provider]  methylphenidate (RITALIN) 10 MG tablet Take 1 tablet by mouth daily in the afternoons as needed 06/25/20   [provider]  omeprazole (PRILOSEC) 40 MG capsule TAKE 1 CAPSULE BY MOUTH EVERY DAY 03/23/19   Everrett Coombe, DO  ondansetron (ZOFRAN) 8 MG tablet Take 1 tab(s) orally 3 times a day, as needed 01/20/20   [provider]  QVAR REDIHALER 40 MCG/ACT inhaler TAKE 2 PUFFS BY MOUTH  TWICE A DAY 06/03/19   Everrett Coombe, DO  rizatriptan (MAXALT) 10 MG tablet TAKE 1 TABLET BY MOUTH EVERY 2 HOURS AS NEEDED. MAX 2 TABS/DAY 01/08/19   Everrett Coombe, DO  traMADol (ULTRAM) 50 MG tablet Take 1 tablet (50 mg total) by mouth every 6 (six) hours as needed. 04/28/21   Marylene Land, CNM  VENTOLIN HFA 108 [phone] Base) MCG/ACT inhaler USE 2 PUFFS AS NEEDED EVERY 6 HRS 08/06/18   Everrett Coombe, DO      Allergies    Barley grass, Gluten meal, Sulfa antibiotics, and Wheat    Review of Systems   Review of Systems  Gastrointestinal:  Positive for abdominal pain, diarrhea, nausea and vomiting.  All other systems reviewed and are negative.   Physical Exam Updated Vital Signs BP 108/71 (BP Location: Right Arm)   Pulse (!) 121   Temp (!) 97.5 F (36.4 C) (Oral)   Resp (!) 22   SpO2 100%   Physical Exam Vitals and nursing note reviewed.  Constitutional:      Appearance: She is well-developed.  HENT:     Head: Normocephalic and atraumatic.  Eyes:     Conjunctiva/sclera: Conjunctivae normal.     Pupils: Pupils are equal, round, and reactive to light.  Cardiovascular:     Rate and Rhythm: Normal rate and regular rhythm.     Heart sounds: Normal heart sounds.  Pulmonary:  Effort: Pulmonary effort is normal.     Breath sounds: Normal breath sounds.  Abdominal:     General: Bowel sounds are normal.     Palpations: Abdomen is soft.     Tenderness: There is generalized abdominal tenderness.  Musculoskeletal:        General: Normal range of motion.     Cervical back: Normal range of motion.  Skin:    General: Skin is warm and dry.  Neurological:     Mental Status: She is alert and oriented to person, place, and time.     ED Results / Procedures / Treatments   Labs (all labs ordered are listed, but only abnormal results are displayed) Labs Reviewed  COMPREHENSIVE METABOLIC PANEL - Abnormal; Notable for the following components:      Result Value   Glucose,  Bld 125 (*)    All other components within normal limits  CBC - Abnormal; Notable for the following components:   WBC 14.7 (*)    All other components within normal limits  URINALYSIS, ROUTINE W REFLEX MICROSCOPIC - Abnormal; Notable for the following components:   Color, Urine AMBER (*)    APPearance CLOUDY (*)    Ketones, ur 20 (*)    All other components within normal limits  LIPASE, BLOOD  I-STAT BETA HCG BLOOD, ED ([facility], [facility], [facility] ONLY)    EKG None  Radiology No results found.  Procedures Procedures    Medications Ordered in ED Medications  sodium chloride 0.9 % bolus 1,000 mL (1,000 mLs Intravenous New Bag/Given 09/20/22 0316)  ondansetron (ZOFRAN) injection 4 mg (4 mg Intravenous Given 09/20/22 0316)  ketorolac (TORADOL) 30 MG/ML injection 30 mg (30 mg Intravenous Given 09/20/22 0316)  dicyclomine (BENTYL) capsule 20 mg (20 mg Oral Given 09/20/22 0429)    ED Course/ Medical Decision Making/ A&P                             Medical Decision Making Amount and/or Complexity of Data Reviewed Labs: ordered. ECG/medicine tests: ordered and independent interpretation performed.  Risk Prescription drug management.   27 year old female presenting to the ED with abdominal pain.  Has history of celiac disease and a Chipotle for dinner, concern for possible cross-contamination.  Symptoms began abruptly around midnight.  She is afebrile and nontoxic in appearance.  She has generalized tenderness on exam without peritoneal signs.  Labs were obtained, does have leukocytosis but no significant electrolyte derangement.  May be reactive from vomiting.  Normal lipase.  She was treated here with IV fluids, Zofran, and Toradol with good improvement of her symptoms.  Still reports some lower abdominal cramping but non-tender currently, given dose of Bentyl.  5:44 AM Patient resting comfortably.  HR improved.  Able to tolerate oral medications.  Low suspicion for acute/surgical abdominal pathology,  do not feel she needs emergent CT at this time.  Appears stable for discharge.  Will have her follow-up closely with PCP.  Return here for any new/acute changes.  Final Clinical Impression(s) / ED Diagnoses Final diagnoses:  Nausea vomiting and diarrhea    Rx / DC Orders ED Discharge Orders          Ordered    ondansetron (ZOFRAN-ODT) 4 MG disintegrating tablet  Every 8 hours PRN        09/20/22 0545    dicyclomine (BENTYL) 20 MG tablet  2 times daily        09/20/22 0545  Garlon Hatchet, PA-C 09/20/22 [id number]    Marily Memos, MD 09/20/22 [phone]

## 2022-09-20 NOTE — ED Triage Notes (Signed)
Pt presents with abdominal pain and vomiting with diarrhea since about midnight.  Pt states she initially woke with diarrhea and felt it may be due to her celiac disease but then began vomiting and having severe abdominal pain with "cold sweats"

## 2022-09-20 NOTE — Discharge Instructions (Signed)
Take the prescribed medication as directed.  Push oral fluids.  Gentle diet for now, advance as tolerated. Follow-up with your primary care doctor. Return to the ED for new or worsening symptoms.

## 2023-02-09 ENCOUNTER — Ambulatory Visit: Payer: BC Managed Care – PPO | Admitting: Obstetrics and Gynecology

## 2023-05-22 ENCOUNTER — Other Ambulatory Visit: Payer: Self-pay | Admitting: Advanced Practice Midwife

## 2023-06-14 ENCOUNTER — Other Ambulatory Visit: Payer: Self-pay

## 2023-06-14 MED ORDER — LEVONORGESTREL-ETHINYL ESTRAD 90-20 MCG PO TABS: 1.0000 | ORAL_TABLET | Freq: Every day | ORAL | 2 refills | Status: DC

## 2023-06-21 ENCOUNTER — Ambulatory Visit (INDEPENDENT_AMBULATORY_CARE_PROVIDER_SITE_OTHER): Payer: 59 | Admitting: Obstetrics and Gynecology

## 2023-06-21 ENCOUNTER — Other Ambulatory Visit (HOSPITAL_COMMUNITY)
Admission: RE | Admit: 2023-06-21 | Discharge: 2023-06-21 | Disposition: A | Discharge status: Home / Self Care | Payer: 59 | Source: Ambulatory Visit | Attending: Obstetrics and Gynecology | Admitting: Obstetrics and Gynecology

## 2023-06-21 ENCOUNTER — Encounter: Payer: Self-pay | Admitting: Obstetrics and Gynecology

## 2023-06-21 ENCOUNTER — Other Ambulatory Visit: Payer: Self-pay

## 2023-06-21 VITALS — BP 117/82 | HR 121 | Wt 227.2 lb

## 2023-06-21 DIAGNOSIS — Z3041 Encounter for surveillance of contraceptive pills: Secondary | ICD-10-CM

## 2023-06-21 DIAGNOSIS — Z124 Encounter for screening for malignant neoplasm of cervix: Secondary | ICD-10-CM | POA: Insufficient documentation

## 2023-06-21 DIAGNOSIS — Z01419 Encounter for gynecological examination (general) (routine) without abnormal findings: Secondary | ICD-10-CM | POA: Diagnosis present

## 2023-06-21 DIAGNOSIS — F32A Depression, unspecified: Secondary | ICD-10-CM

## 2023-06-21 DIAGNOSIS — F419 Anxiety disorder, unspecified: Secondary | ICD-10-CM | POA: Diagnosis not present

## 2023-06-21 DIAGNOSIS — Z1331 Encounter for screening for depression: Secondary | ICD-10-CM | POA: Diagnosis not present

## 2023-06-21 DIAGNOSIS — Z113 Encounter for screening for infections with a predominantly sexual mode of transmission: Secondary | ICD-10-CM | POA: Diagnosis not present

## 2023-06-21 MED ORDER — TRAMADOL HCL 50 MG PO TABS
50.0000 mg | ORAL_TABLET | Freq: Four times a day (QID) | ORAL | 0 refills | Status: AC | PRN
Start: 2023-06-21 — End: ?

## 2023-06-21 MED ORDER — LEVONORGESTREL-ETHINYL ESTRAD 90-20 MCG PO TABS
1.0000 | ORAL_TABLET | Freq: Every day | ORAL | 3 refills | Status: AC
Start: 2023-06-21 — End: ?

## 2023-06-21 MED ORDER — DULOXETINE HCL 30 MG PO CPEP
30.0000 mg | ORAL_CAPSULE | Freq: Three times a day (TID) | ORAL | 0 refills | Status: AC
Start: 2023-06-21 — End: 2023-11-11

## 2023-06-21 NOTE — Progress Notes (Signed)
 ANNUAL EXAM Patient name: Jill Vincent MRN [id number]  Date of birth: Oct 19, 1995 Chief Complaint:   Gynecologic Exam (With PAP)  History of Present Illness:   Jill Vincent is a 28 y.o. No obstetric history on file.  female being seen today for a routine annual exam.  Current complaints:  doing well on OCP, does continuous dosing, no vaginal or pelvic complaints. Hx of ruptured ovarian cysts, sometimes requiring hospitalization. Uses tramadol  very infrequently  No LMP recorded. (Menstrual status: Irregular Periods).   The pregnancy intention screening data noted above was reviewed. Potential methods of contraception were discussed. The patient elected to proceed with OCP  Last pap 04/05/22. Results were: NILM w/ HRHPV negative. H/O abnormal pap: yes 2022 ASCUS w HPV Last mammogram: n/a. Results were: N/A. Family h/o breast cancer: yes mom (dx at 82) and maternal grandmother (dx 34),       06/21/2023   10:35 AM 04/23/2021    3:45 PM 02/19/2021   10:07 AM 05/18/2018   11:24 AM  Depression screen PHQ 2/9  Decreased Interest 2 1 1  0  Down, Depressed, Hopeless 2 0 1 0  PHQ - 2 Score 4 1 2  0  Altered sleeping 2 1 2    Tired, decreased energy 2 1 2    Change in appetite 2 1 1    Feeling bad or failure about yourself  1 0 0   Trouble concentrating 2 0 1   Moving slowly or fidgety/restless 2 0 0   Suicidal thoughts 0 0 0   PHQ-9 Score 15 4 8          06/21/2023   10:35 AM 04/23/2021    3:45 PM 02/19/2021   10:08 AM  GAD 7 : Generalized Anxiety Score  Nervous, Anxious, on Edge 2 3 2   Control/stop worrying 2 3 1   Worry too much - different things 2 3 1   Trouble relaxing 2 3 1   Restless 1 2 1   Easily annoyed or irritable 1 1 1   Afraid - awful might happen 0 2 0  Total GAD 7 Score 10 17 7      Review of Systems:   Pertinent items are noted in HPI Denies any headaches, blurred vision, fatigue, shortness of breath, chest pain, abdominal pain, abnormal vaginal  discharge/itching/odor/irritation, problems with periods, bowel movements, urination, or intercourse unless otherwise stated above. Pertinent History Reviewed:  Reviewed past medical,surgical, social and family history.  Reviewed problem list, medications and allergies. Physical Assessment:   Vitals:   06/21/23 1025 06/21/23 1030  BP: (!) 124/91 117/82  Pulse: (!) 126 (!) 121  Weight: 227 lb 3 oz (103.1 kg)   Body mass index is 39 kg/m.        Physical Examination:   General appearance - well appearing, and in no distress  Mental status - alert, oriented to person, place, and time  Psych:  She has a normal mood and affect  Skin - warm and dry, normal color, no suspicious lesions noted  Chest - effort normal  Heart - normal rate and regular rhythm  Neck:  midline trachea  Breasts - declined  Abdomen - soft, nontender, nondistended, no masses or organomegaly  Pelvic - VULVA: normal appearing vulva with no masses, tenderness or lesions  VAGINA: normal appearing vagina with normal color and discharge, no lesions  CERVIX: normal appearing cervix without discharge or lesions, no CMT  Thin prep pap is done w HR HPV cotesting  UTERUS: uterus is felt to be normal size, shape,  consistency and nontender   ADNEXA: No adnexal masses or tenderness noted.    Extremities:  No swelling or varicosities noted  Chaperone present for exam  No results found for this or any previous visit (from the past 24 hours).  Assessment & Plan:  1. Well woman exam (Primary) Continue self breast exams, mammogram at 38   Discussed trial of toradol  in place of tramadol , reports taking very infrequent, last refill 2022, would like to stick with tramadol  to prevent need for hospital - traMADol  (ULTRAM ) 50 MG tablet; Take 1 tablet (50 mg total) by mouth every 6 (six) hours as needed.  Dispense: 10 tablet; Refill: 0 . 2. Cervical cancer screening Per guidelines follow up one year, collected today    - Cytology -  PAP( [facility])  3. Surveillance for birth control, oral contraceptives Continue continuous dosing, refill sent  - levonorgestrel -ethinyl estradiol (LYBREL) 90-20 MCG tablet; Take 1 tablet by mouth daily.  Dispense: 84 tablet; Refill: 3  4. Anxiety and depression Took last pill today, need refill while waiting for refill from PCP  - DULoxetine  (CYMBALTA ) 30 MG capsule; Take 1 capsule (30 mg total) by mouth 3 (three) times daily.  Dispense: 90 capsule; Refill: 0  5. Screen for STD (sexually transmitted disease)  - RPR+HBsAg+HCVAb+...   Labs/procedures today:   Mammogram: @ 28yo, or sooner if problems   Orders Placed This Encounter  Procedures   RPR+HBsAg+HCVAb+...    Meds:  Meds ordered this encounter  Medications   levonorgestrel -ethinyl estradiol (LYBREL) 90-20 MCG tablet    Sig: Take 1 tablet by mouth daily.    Dispense:  84 tablet    Refill:  3   traMADol  (ULTRAM ) 50 MG tablet    Sig: Take 1 tablet (50 mg total) by mouth every 6 (six) hours as needed.    Dispense:  10 tablet    Refill:  0   DULoxetine  (CYMBALTA ) 30 MG capsule    Sig: Take 1 capsule (30 mg total) by mouth 3 (three) times daily.    Dispense:  90 capsule    Refill:  0    Follow-up: Return in about 1 year (around 06/20/2024) for Jill [place] [name], FNP

## 2023-06-22 LAB — RPR+HBSAG+HCVAB+...
HIV Screen 4th Generation wRfx: NONREACTIVE
Hep C Virus Ab: NONREACTIVE
Hepatitis B Surface Ag: NEGATIVE
RPR Ser Ql: NONREACTIVE

## 2023-06-23 LAB — CYTOLOGY - PAP
Adequacy: ABSENT
Chlamydia: NEGATIVE
Comment: NEGATIVE
Comment: NEGATIVE
Comment: NEGATIVE
Comment: NORMAL
Diagnosis: NEGATIVE
High risk HPV: NEGATIVE
Neisseria Gonorrhea: NEGATIVE
Trichomonas: NEGATIVE

## 2023-07-13 ENCOUNTER — Ambulatory Visit: Payer: 59 | Admitting: Internal Medicine

## 2023-09-01 IMAGING — MR MR FOOT*R* W/O CM
4 of 5 series · 24 of 40 positions shown · non-contrast
Comparison: right foot radiographs (weight-bearing) 10/01/2021,
right ankle radiographs 09/26/2021

CLINICAL DATA: Foot trauma. Lisfranc injury suspected. X-ray
equivocal. Pain, swelling, and weakness. Numbness in toes for
weeks due to missing a step.

EXAM:
MRI OF THE RIGHT FOREFOOT WITHOUT CONTRAST
TECHNIQUE: Multiplanar, multisequence MR imaging of the right forefoot was
performed. No intravenous contrast was administered.

[Series 4: T1 · coronal · 3.0mm · 0.25mm/px · 4 of 50 slices shown (1 of 2)]
[im 1/50]
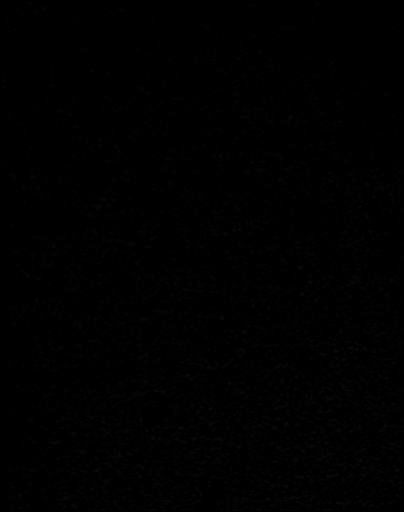
[im 5/50]
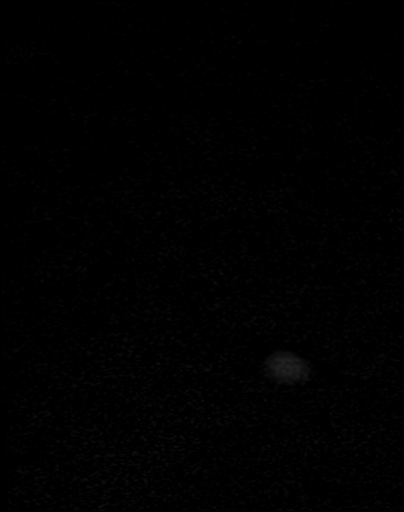
[im 25/50]
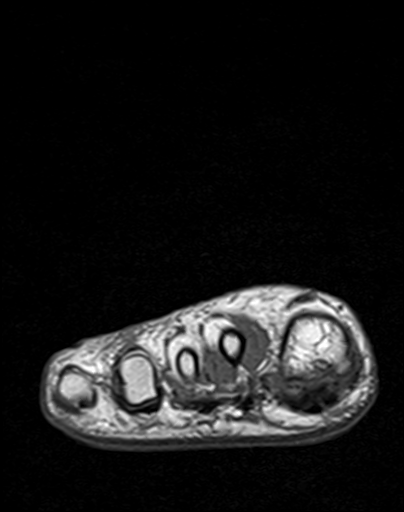
[im 45/50]
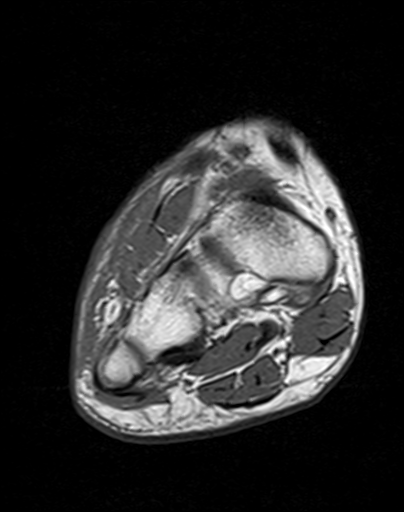

[Series 5: T2 fat-sat · coronal · 3.0mm · 0.25mm/px · 11 of 50 slices shown (1 of 2)]
[im 1/50]
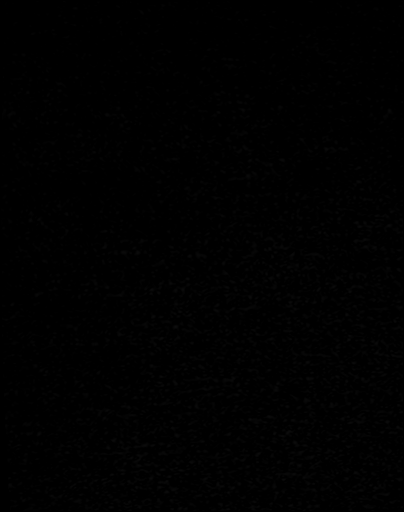
[im 5/50]
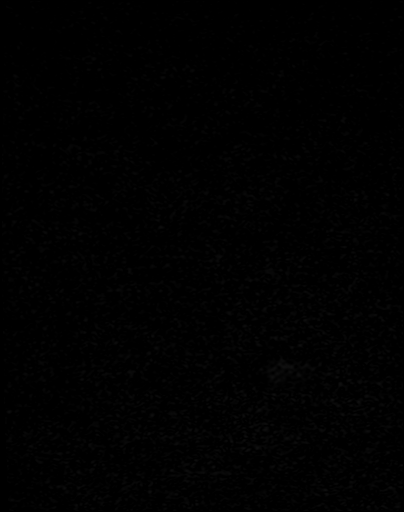
[im 10/50]
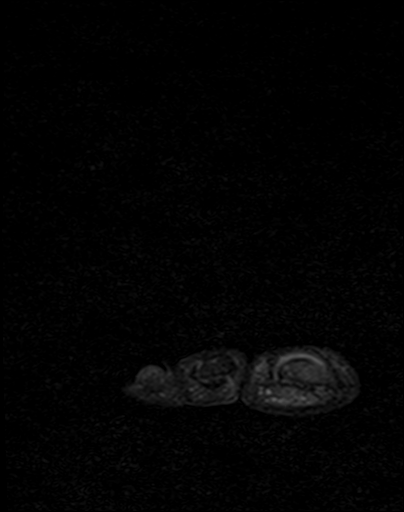
[im 15/50]
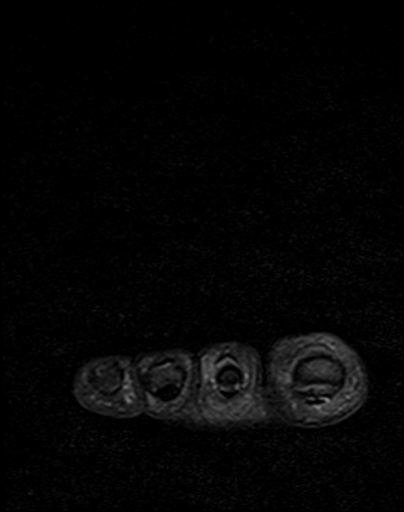
[im 20/50]
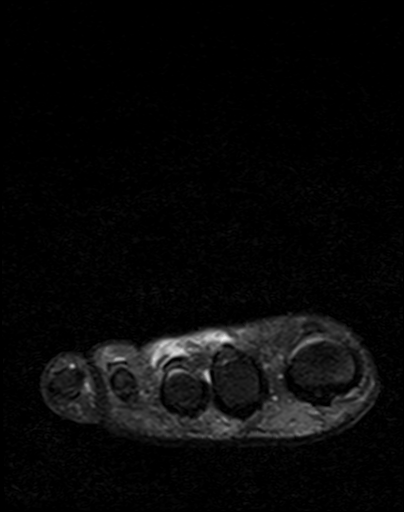
[im 25/50]
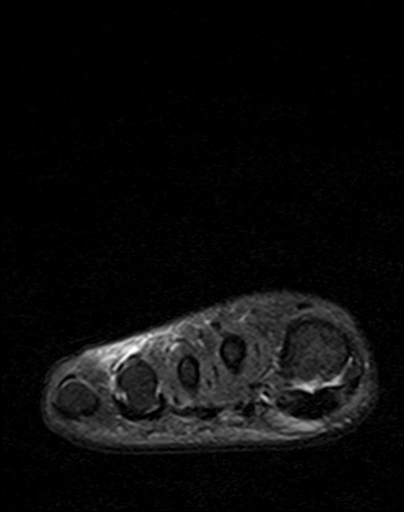
[im 30/50]
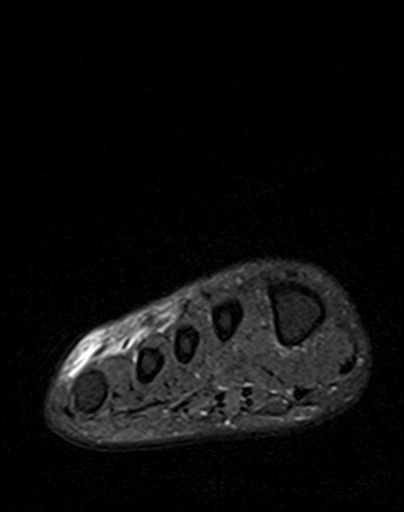
[im 35/50]
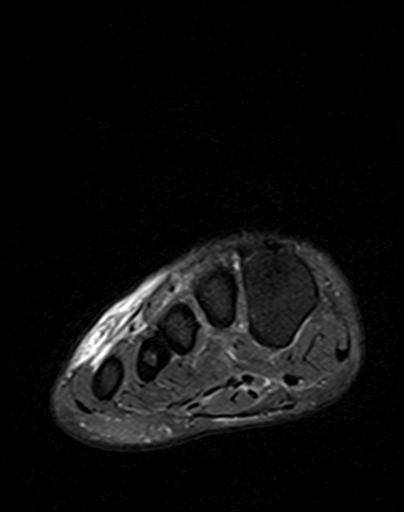
[im 40/50]
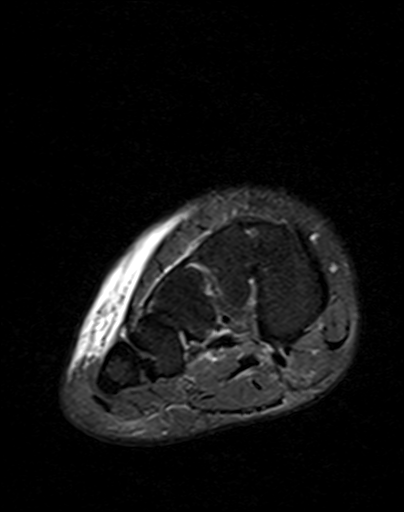
[im 45/50]
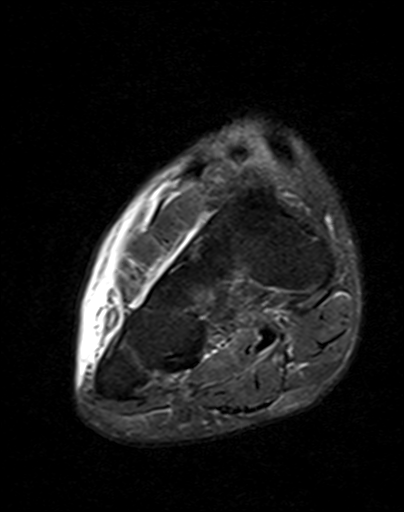
[im 50/50]
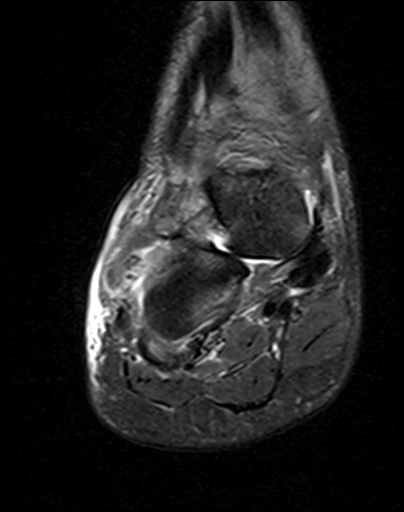

[Series 6: T2 fat-sat · axial · 3.0mm · 0.35mm/px · z∈[-81,+25]mm · 6 of 28 slices shown (2 of 2)]
[im 1/28]
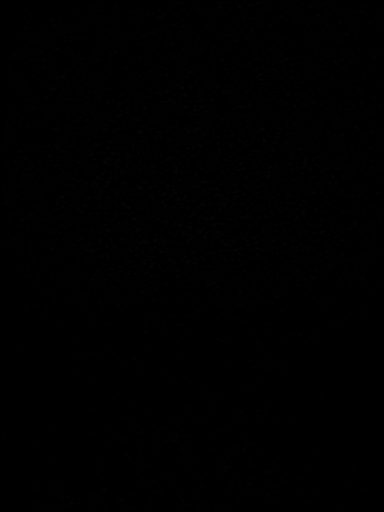
[im 6/28]
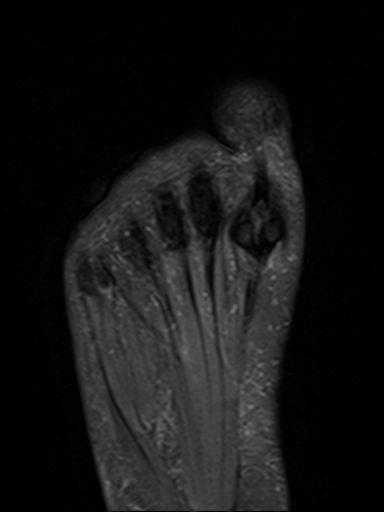
[im 11/28]
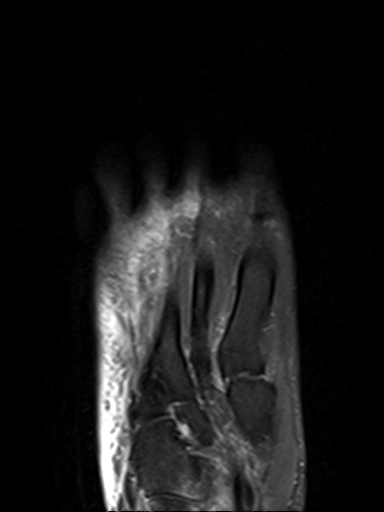
[im 17/28]
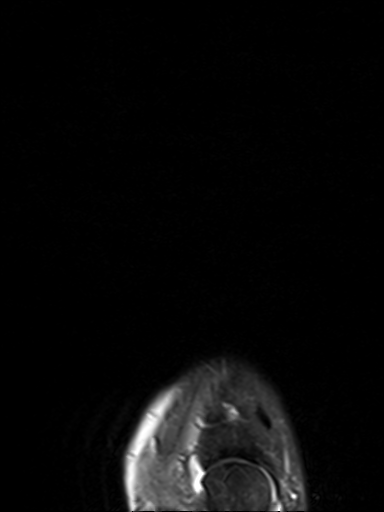
[im 22/28]
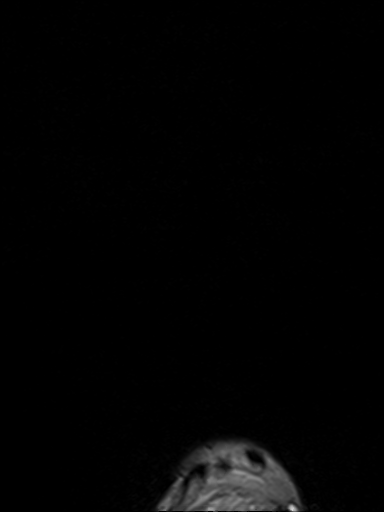
[im 28/28]
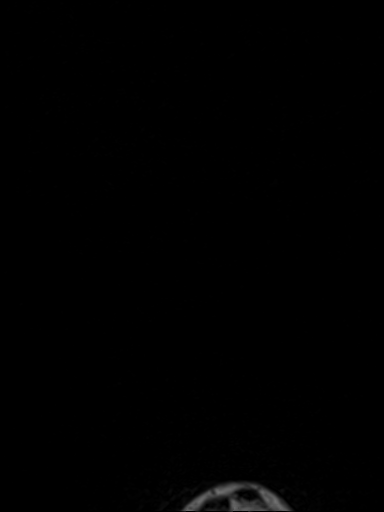

[Series 7: T1 · axial · 3.0mm · 0.35mm/px · z∈[-61,+25]mm · 3 of 28 slices shown (2 of 2)]
[im 6/28]
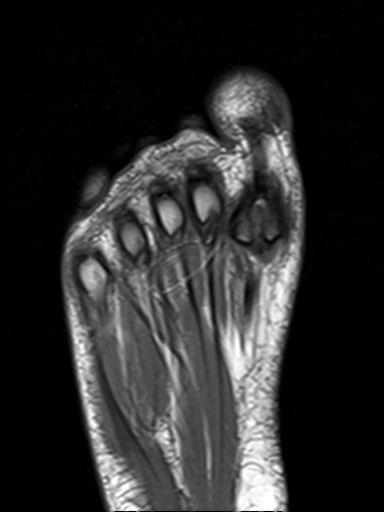
[im 17/28]
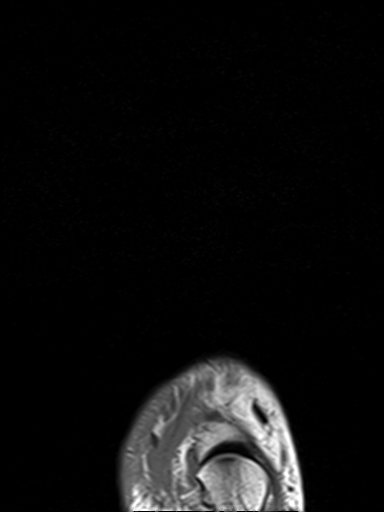
[im 28/28]
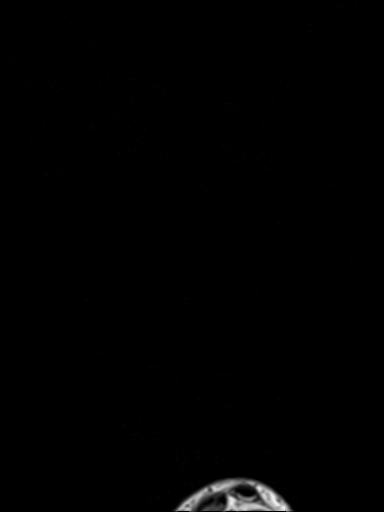

[24 of 40 positions shown; findings below may reference images not displayed]

FINDINGS: Bones/Joint/Cartilage

Mild great toe metatarsophalangeal joint space narrowing, cartilage
thinning, and peripheral degenerative spurring.

There is marrow edema seen within the far dorsal medial aspect of
the cuboid. There appears to be a nondisplaced likely subacute
fracture line in this region (coronal series 7, image 17).

Ligaments

The dorsal, interosseous, and plantar aspect of the Lisfranc
ligament complex appear intact (axial series 5 images 10 through
13).

The plantar plates appear intact.

Muscles and Tendons

Mild edema within the dorsal midfoot extensor digitorum brevis
muscle body. No tendon tear is seen.

Soft tissues

Mild-to-moderate edema and swelling of the dorsal lateral midfoot
subcutaneous fat.
IMPRESSION: :
IMPRESSION: 1. Note is made of moderate lateral midfoot to hindfoot soft tissue
swelling on 09/26/2021 ankle radiographs in the region of the
lateral calcaneus and cuboid. On the current MRI, there is moderate
lateral and dorsal midfoot subcutaneous fat swelling and edema, and
there is moderate marrow edema within the dorsal medial aspect of
the cuboid surrounding a probable tiny nondisplaced subacute
fracture.
2. The Lisfranc ligament complex is intact.

## 2023-10-26 ENCOUNTER — Encounter (HOSPITAL_BASED_OUTPATIENT_CLINIC_OR_DEPARTMENT_OTHER): Payer: Self-pay | Admitting: Emergency Medicine

## 2023-10-26 ENCOUNTER — Other Ambulatory Visit: Payer: Self-pay

## 2023-10-26 ENCOUNTER — Emergency Department (HOSPITAL_BASED_OUTPATIENT_CLINIC_OR_DEPARTMENT_OTHER)
Admission: EM | Admit: 2023-10-26 | Discharge: 2023-10-26 | Disposition: A | Discharge status: Home / Self Care | Attending: Emergency Medicine | Admitting: Emergency Medicine

## 2023-10-26 ENCOUNTER — Emergency Department (HOSPITAL_BASED_OUTPATIENT_CLINIC_OR_DEPARTMENT_OTHER)

## 2023-10-26 DIAGNOSIS — R1084 Generalized abdominal pain: Secondary | ICD-10-CM | POA: Insufficient documentation

## 2023-10-26 DIAGNOSIS — R1032 Left lower quadrant pain: Secondary | ICD-10-CM | POA: Diagnosis present

## 2023-10-26 DIAGNOSIS — R030 Elevated blood-pressure reading, without diagnosis of hypertension: Secondary | ICD-10-CM

## 2023-10-26 LAB — URINALYSIS, ROUTINE W REFLEX MICROSCOPIC
Bilirubin Urine: NEGATIVE
Glucose, UA: NEGATIVE mg/dL
Hgb urine dipstick: NEGATIVE
Ketones, ur: NEGATIVE mg/dL
Leukocytes,Ua: NEGATIVE
Nitrite: NEGATIVE
Protein, ur: NEGATIVE mg/dL
Specific Gravity, Urine: 1.018 (ref 1.005–1.030)
pH: 6.5 (ref 5.0–8.0)

## 2023-10-26 LAB — CBC
HCT: 40.1 % (ref 36.0–46.0)
Hemoglobin: 13.4 g/dL (ref 12.0–15.0)
MCH: 28.9 pg (ref 26.0–34.0)
MCHC: 33.4 g/dL (ref 30.0–36.0)
MCV: 86.4 fL (ref 80.0–100.0)
Platelets: 401 10*3/uL — ABNORMAL HIGH (ref 150–400)
RBC: 4.64 MIL/uL (ref 3.87–5.11)
RDW: 13.8 % (ref 11.5–15.5)
WBC: 7.1 10*3/uL (ref 4.0–10.5)
nRBC: 0 % (ref 0.0–0.2)

## 2023-10-26 LAB — COMPREHENSIVE METABOLIC PANEL WITH GFR
ALT: 26 U/L (ref 0–44)
AST: 26 U/L (ref 15–41)
Albumin: 4.1 g/dL (ref 3.5–5.0)
Alkaline Phosphatase: 84 U/L (ref 38–126)
Anion gap: 11 (ref 5–15)
BUN: 10 mg/dL (ref 6–20)
CO2: 24 mmol/L (ref 22–32)
Calcium: 9.3 mg/dL (ref 8.9–10.3)
Chloride: 101 mmol/L (ref 98–111)
Creatinine, Ser: 0.8 mg/dL (ref 0.44–1.00)
GFR, Estimated: >60 mL/min (ref >60)
Glucose, Bld: 77 mg/dL (ref 70–99)
Potassium: 3.7 mmol/L (ref 3.5–5.1)
Sodium: 137 mmol/L (ref 135–145)
Total Bilirubin: 0.3 mg/dL (ref 0.0–1.2)
Total Protein: 7.6 g/dL (ref 6.5–8.1)

## 2023-10-26 LAB — LIPASE, BLOOD: Lipase: 28 U/L (ref 11–51)

## 2023-10-26 LAB — PREGNANCY, URINE: Preg Test, Ur: NEGATIVE

## 2023-10-26 MED ORDER — ONDANSETRON HCL 4 MG/2ML IJ SOLN
4.0000 mg | Freq: Once | INTRAMUSCULAR | Status: AC
  Administered 2023-10-26: 4 mg via INTRAVENOUS
  Filled 2023-10-26: qty 2

## 2023-10-26 MED ORDER — ACETAMINOPHEN 500 MG PO TABS
1000.0000 mg | ORAL_TABLET | Freq: Once | ORAL | Status: AC
  Administered 2023-10-26: 1000 mg via ORAL
  Filled 2023-10-26: qty 2

## 2023-10-26 MED ORDER — ONDANSETRON HCL 4 MG/2ML IJ SOLN
4.0000 mg | Freq: Once | INTRAMUSCULAR | Status: AC | PRN
  Administered 2023-10-26: 4 mg via INTRAVENOUS
  Filled 2023-10-26: qty 2

## 2023-10-26 MED ORDER — MORPHINE SULFATE (PF) 4 MG/ML IV SOLN
4.0000 mg | Freq: Once | INTRAVENOUS | Status: AC
  Administered 2023-10-26: 4 mg via INTRAVENOUS
  Filled 2023-10-26: qty 1

## 2023-10-26 MED ORDER — IBUPROFEN 400 MG PO TABS
400.0000 mg | ORAL_TABLET | Freq: Once | ORAL | Status: DC
  Filled 2023-10-26: qty 1

## 2023-10-26 NOTE — ED Triage Notes (Signed)
 Abd pain several days- markedly worse past 24hrs 8:10 consistently despite NSAIDs and muscle relaxer- HX PCOS and ovarian cyst. Nauseated. Lower left pelvic pain- radiating into back. Denies urinary symptoms. Afebrile.

## 2023-10-26 NOTE — ED Provider Notes (Signed)
  EMERGENCY DEPARTMENT AT [facility] Provider Note   CSN: [id number] Arrival date & time: 10/26/23  1640     History  Chief Complaint  Patient presents with   Abdominal Pain    Jill Vincent is a 28 y.o. female.  Patient c/o left lower abd/flank pain in past couple weeks, crampy, intermittent, and indicates last night pain worse. No specific exacerbating or alleviating factors. Non radiating. Notes hx pcos, unsure if similar. No vaginal discharge or bleeding. Indicates with her bcp gets ~ 1-2 periods per year, no regular cycles. Denies dysuria or hematuria. No hx kidney stone. No fever or chills. Having normal bms. No vomiting.   The history is provided by the patient and medical records.  Abdominal Pain Associated symptoms: no chest pain, no chills, no constipation, no cough, no diarrhea, no dysuria, no fever, no hematuria, no shortness of breath, no vaginal bleeding, no vaginal discharge and no vomiting        Home Medications Prior to Admission medications   Medication Sig Start Date End Date Taking? Authorizing Provider  ALPRAZolam  (XANAX ) 0.5 MG tablet Take one pill per day as needed 04/07/20   [provider]  dicyclomine  (BENTYL ) 20 MG tablet Take 1 tablet (20 mg total) by mouth 2 (two) times daily. Patient not taking: Reported on 06/21/2023 09/20/22   Coretha Dew, PA-C  DULoxetine  (CYMBALTA ) 30 MG capsule Take 1 capsule (30 mg total) by mouth 3 (three) times daily. 06/21/23 09/19/23  Zelma Hidden, FNP  levonorgestrel -ethinyl estradiol (LYBREL) 90-20 MCG tablet Take 1 tablet by mouth daily. 06/21/23   Zelma Hidden, FNP  methylphenidate  (RITALIN  LA) 40 MG 24 hr capsule Take 40 mg by mouth every morning.    [provider]  methylphenidate  (RITALIN ) 10 MG tablet Take 1 tablet by mouth daily in the afternoons as needed Patient not taking: Reported on 06/21/2023 06/25/20   [provider]  omeprazole  (PRILOSEC) 40 MG capsule  TAKE 1 CAPSULE BY MOUTH EVERY DAY 03/23/19   Adela Holter, DO  ondansetron  (ZOFRAN ) 8 MG tablet Take 1 tab(s) orally 3 times a day, as needed 01/20/20   [provider]  ondansetron  (ZOFRAN -ODT) 4 MG disintegrating tablet Take 1 tablet (4 mg total) by mouth every 8 (eight) hours as needed for nausea. 09/20/22   Coretha Dew, PA-C  QVAR  REDIHALER 40 MCG/ACT inhaler TAKE 2 PUFFS BY MOUTH TWICE A DAY 06/03/19   Adela Holter, DO  rizatriptan  (MAXALT ) 10 MG tablet TAKE 1 TABLET BY MOUTH EVERY 2 HOURS AS NEEDED. MAX 2 TABS/DAY 01/08/19   Adela Holter, DO  traMADol  (ULTRAM ) 50 MG tablet Take 1 tablet (50 mg total) by mouth every 6 (six) hours as needed. 06/21/23   Zelma Hidden, FNP  VENTOLIN HFA 108 (90 Base) MCG/ACT inhaler USE 2 PUFFS AS NEEDED EVERY 6 HRS 08/06/18   Adela Holter, DO      Allergies    Barley grass, Gluten meal, Sulfa antibiotics, and Wheat    Review of Systems   Review of Systems  Constitutional:  Negative for chills and fever.  Respiratory:  Negative for cough and shortness of breath.   Cardiovascular:  Negative for chest pain.  Gastrointestinal:  Positive for abdominal pain. Negative for constipation, diarrhea and vomiting.  Genitourinary:  Negative for dysuria, flank pain, hematuria, vaginal bleeding and vaginal discharge.  Musculoskeletal:  Negative for back pain.  Skin:  Negative for rash.    Physical Exam Updated Vital Signs BP Aaron Aas)  141/96   Pulse 90   Temp 97.8 F (36.6 C) (Oral)   Resp 16   SpO2 100%  Physical Exam Vitals and nursing note reviewed.  Constitutional:      Appearance: Normal appearance. She is well-developed.  HENT:     Head: Atraumatic.     Nose: Nose normal.     Mouth/Throat:     Mouth: Mucous membranes are moist.  Eyes:     General: No scleral icterus.    Conjunctiva/sclera: Conjunctivae normal.  Neck:     Trachea: No tracheal deviation.  Cardiovascular:     Rate and Rhythm: Normal rate and regular rhythm.     Pulses:  Normal pulses.     Heart sounds: Normal heart sounds. No murmur heard.    No friction rub. No gallop.  Pulmonary:     Effort: Pulmonary effort is normal. No respiratory distress.     Breath sounds: Normal breath sounds.  Abdominal:     General: Bowel sounds are normal. There is no distension.     Palpations: Abdomen is soft. There is no mass.     Tenderness: There is no abdominal tenderness. There is no guarding or rebound.     Hernia: No hernia is present.  Genitourinary:    Comments: No cva tenderness.  Musculoskeletal:        General: No swelling.     Cervical back: Neck supple. No muscular tenderness.     Right lower leg: No edema.     Left lower leg: No edema.  Skin:    General: Skin is warm and dry.     Findings: No rash.  Neurological:     Mental Status: She is alert.     Comments: Alert, speech normal.   Psychiatric:        Mood and Affect: Mood normal.     ED Results / Procedures / Treatments   Labs (all labs ordered are listed, but only abnormal results are displayed) Results for orders placed or performed during the hospital encounter of 10/26/23  Lipase, blood   Collection Time: 10/26/23  5:02 PM  Result Value Ref Range   Lipase 28 11 - 51 U/L  Comprehensive metabolic panel   Collection Time: 10/26/23  5:02 PM  Result Value Ref Range   Sodium 137 135 - 145 mmol/L   Potassium 3.7 3.5 - 5.1 mmol/L   Chloride 101 98 - 111 mmol/L   CO2 24 22 - 32 mmol/L   Glucose, Bld 77 70 - 99 mg/dL   BUN 10 6 - 20 mg/dL   Creatinine, Ser 4.09 0.44 - 1.00 mg/dL   Calcium 9.3 8.9 - 81.1 mg/dL   Total Protein 7.6 6.5 - 8.1 g/dL   Albumin 4.1 3.5 - 5.0 g/dL   AST 26 15 - 41 U/L   ALT 26 0 - 44 U/L   Alkaline Phosphatase 84 38 - 126 U/L   Total Bilirubin 0.3 0.0 - 1.2 mg/dL   GFR, Estimated >91 >47 mL/min   Anion gap 11 5 - 15  CBC   Collection Time: 10/26/23  5:02 PM  Result Value Ref Range   WBC 7.1 4.0 - 10.5 K/uL   RBC 4.64 3.87 - 5.11 MIL/uL   Hemoglobin 13.4  12.0 - 15.0 g/dL   HCT 82.9 56.2 - 13.0 %   MCV 86.4 80.0 - 100.0 fL   MCH 28.9 26.0 - 34.0 pg   MCHC 33.4 30.0 - 36.0 g/dL   RDW  13.8 11.5 - 15.5 %   Platelets 401 (H) 150 - 400 K/uL   nRBC 0.0 0.0 - 0.2 %  Urinalysis, Routine w reflex microscopic -Urine, Clean Catch   Collection Time: 10/26/23  5:02 PM  Result Value Ref Range   Color, Urine YELLOW YELLOW   APPearance CLEAR CLEAR   Specific Gravity, Urine 1.018 1.005 - 1.030   pH 6.5 5.0 - 8.0   Glucose, UA NEGATIVE NEGATIVE mg/dL   Hgb urine dipstick NEGATIVE NEGATIVE   Bilirubin Urine NEGATIVE NEGATIVE   Ketones, ur NEGATIVE NEGATIVE mg/dL   Protein, ur NEGATIVE NEGATIVE mg/dL   Nitrite NEGATIVE NEGATIVE   Leukocytes,Ua NEGATIVE NEGATIVE  Pregnancy, urine   Collection Time: 10/26/23  5:02 PM  Result Value Ref Range   Preg Test, Ur NEGATIVE NEGATIVE      EKG None  Radiology CT Renal Stone Study Result Date: 10/26/2023 CLINICAL DATA:  Left flank pain. EXAM: CT ABDOMEN AND PELVIS WITHOUT CONTRAST TECHNIQUE: Multidetector CT imaging of the abdomen and pelvis was performed following the standard protocol without IV contrast. RADIATION DOSE REDUCTION: This exam was performed according to the departmental dose-optimization program which includes automated exposure control, adjustment of the mA and/or kV according to patient size and/or use of iterative reconstruction technique. COMPARISON:  February 17, 2021 FINDINGS: Lower chest: No acute abnormality. Hepatobiliary: No focal liver abnormality is seen. No gallstones, gallbladder wall thickening, or biliary dilatation. Pancreas: Unremarkable. No pancreatic ductal dilatation or surrounding inflammatory changes. Spleen: Normal in size without focal abnormality. Adrenals/Urinary Tract: Adrenal glands are unremarkable. Kidneys are normal, without renal calculi, focal lesion, or hydronephrosis. The urinary bladder is empty and subsequently limited in evaluation. Stomach/Bowel: Stomach is  within normal limits. Appendix appears normal. No evidence of bowel wall thickening, distention, or inflammatory changes. Vascular/Lymphatic: No significant vascular findings are present. No enlarged abdominal or pelvic lymph nodes. Reproductive: Uterus and bilateral adnexa are unremarkable. Other: No abdominal wall hernia or abnormality. No abdominopelvic ascites. Musculoskeletal: No acute or significant osseous findings. IMPRESSION: No acute or active process within the abdomen or pelvis. Electronically Signed   By: Virgle Grime M.D.   On: 10/26/2023 20:40    Procedures Procedures    Medications Ordered in ED Medications  ibuprofen (ADVIL) tablet 400 mg (400 mg Oral Not Given 10/26/23 1800)  ondansetron  (ZOFRAN ) injection 4 mg (4 mg Intravenous Given 10/26/23 1709)  acetaminophen  (TYLENOL ) tablet 1,000 mg (1,000 mg Oral Given 10/26/23 1853)  morphine  (PF) 4 MG/ML injection 4 mg (4 mg Intravenous Given 10/26/23 1911)  ondansetron  (ZOFRAN ) injection 4 mg (4 mg Intravenous Given 10/26/23 1911)    ED Course/ Medical Decision Making/ A&P                                 Medical Decision Making Problems Addressed: Abdominal pain, generalized: acute illness or injury with systemic symptoms that poses a threat to life or bodily functions Elevated blood pressure reading: acute illness or injury  Amount and/or Complexity of Data Reviewed External Data Reviewed: notes. Labs: ordered. Decision-making details documented in ED Course. Radiology: ordered and independent interpretation performed. Decision-making details documented in ED Course.  Risk OTC drugs. Prescription drug management. Parenteral controlled substances. Decision regarding hospitalization.   Labs ordered/sent. Imaging ordered.   Differential diagnosis includes ovarian cyst, ureteral stone, abd cramping, etc. Dispo decision including potential need for admission considered - will get labs and imaging and reassess.  Reviewed nursing notes and prior charts for additional history. External reports reviewed.   Pt requests pain med, indicates otc meds did not help. Patient indicates has ride, does not have to drive. Morphine  iv. Zofran  iv.   Labs reviewed/interpreted by me - wbc and hgb normal. Chem normal. Preg neg. Ua neg for uti.   Acetaminophen  po. Ibuprofen po.   CT reviewed/interpreted by me - no diverticulitis, mass or acute process.   Recheck pt comfortable, no distress. Pt appears stable for d/c.   Rec close pcp f/u.  Return precautions provided.          Final Clinical Impression(s) / ED Diagnoses Final diagnoses:  Abdominal pain, generalized  Elevated blood pressure reading    Rx / DC Orders ED Discharge Orders     None         Guadalupe Lee, MD 10/26/23 2124

## 2023-10-26 NOTE — Discharge Instructions (Addendum)
 It was our pleasure to provide your ER care today - we hope that you feel better.  Drink plenty of fluids/stay well hydrated.   Take acetaminophen  or ibuprofen as need.   Follow up with primary care doctor in the coming week.  Return to ER if worse, new symptoms, fevers, new or worsening or severe abdominal pain, persistent vomiting, or other concern.  You were given pain meds in the ER - no driving for the next 6 hours.

## 2023-10-28 ENCOUNTER — Other Ambulatory Visit: Payer: Self-pay

## 2023-10-28 ENCOUNTER — Encounter (HOSPITAL_BASED_OUTPATIENT_CLINIC_OR_DEPARTMENT_OTHER): Payer: Self-pay | Admitting: Emergency Medicine

## 2023-10-28 ENCOUNTER — Emergency Department (HOSPITAL_BASED_OUTPATIENT_CLINIC_OR_DEPARTMENT_OTHER)

## 2023-10-28 ENCOUNTER — Emergency Department (HOSPITAL_BASED_OUTPATIENT_CLINIC_OR_DEPARTMENT_OTHER): Admission: EM | Admit: 2023-10-28 | Discharge: 2023-10-28 | Disposition: A | Discharge status: Home / Self Care

## 2023-10-28 DIAGNOSIS — R102 Pelvic and perineal pain unspecified side: Secondary | ICD-10-CM

## 2023-10-28 DIAGNOSIS — N946 Dysmenorrhea, unspecified: Secondary | ICD-10-CM

## 2023-10-28 DIAGNOSIS — D259 Leiomyoma of uterus, unspecified: Secondary | ICD-10-CM

## 2023-10-28 DIAGNOSIS — N939 Abnormal uterine and vaginal bleeding, unspecified: Secondary | ICD-10-CM | POA: Diagnosis present

## 2023-10-28 LAB — CBC
HCT: 40.3 % (ref 36.0–46.0)
Hemoglobin: 13.4 g/dL (ref 12.0–15.0)
MCH: 28.9 pg (ref 26.0–34.0)
MCHC: 33.3 g/dL (ref 30.0–36.0)
MCV: 86.9 fL (ref 80.0–100.0)
Platelets: 388 10*3/uL (ref 150–400)
RBC: 4.64 MIL/uL (ref 3.87–5.11)
RDW: 13.6 % (ref 11.5–15.5)
WBC: 7.7 10*3/uL (ref 4.0–10.5)
nRBC: 0 % (ref 0.0–0.2)

## 2023-10-28 LAB — COMPREHENSIVE METABOLIC PANEL WITH GFR
ALT: 23 U/L (ref 0–44)
AST: 22 U/L (ref 15–41)
Albumin: 4.2 g/dL (ref 3.5–5.0)
Alkaline Phosphatase: 73 U/L (ref 38–126)
Anion gap: 12 (ref 5–15)
BUN: 13 mg/dL (ref 6–20)
CO2: 25 mmol/L (ref 22–32)
Calcium: 9.7 mg/dL (ref 8.9–10.3)
Chloride: 101 mmol/L (ref 98–111)
Creatinine, Ser: 0.84 mg/dL (ref 0.44–1.00)
GFR, Estimated: >60 mL/min (ref >60)
Glucose, Bld: 81 mg/dL (ref 70–99)
Potassium: 3.5 mmol/L (ref 3.5–5.1)
Sodium: 139 mmol/L (ref 135–145)
Total Bilirubin: <0.2 mg/dL (ref 0.0–1.2)
Total Protein: 8.1 g/dL (ref 6.5–8.1)

## 2023-10-28 LAB — LIPASE, BLOOD: Lipase: 32 U/L (ref 11–51)

## 2023-10-28 MED ORDER — ONDANSETRON 4 MG PO TBDP
4.0000 mg | ORAL_TABLET | Freq: Once | ORAL | Status: AC
  Administered 2023-10-28: 4 mg via ORAL
  Filled 2023-10-28: qty 1

## 2023-10-28 MED ORDER — KETOROLAC TROMETHAMINE 60 MG/2ML IM SOLN
60.0000 mg | Freq: Once | INTRAMUSCULAR | Status: AC
  Administered 2023-10-28: 60 mg via INTRAMUSCULAR
  Filled 2023-10-28: qty 2

## 2023-10-28 MED ORDER — OXYCODONE HCL 5 MG PO TABS: 2.5000 mg | ORAL_TABLET | ORAL | 0 refills | Status: DC | PRN

## 2023-10-28 NOTE — ED Triage Notes (Signed)
 Pt caox4 c/o lower abd cramping, nausea, and heavy menstrual cycle. Pt states she was here a couple days ago for same complaint and was recommended an ultrasound but there was no US  available at the time. Pt reports pain has been worsening over the past week but has been getting unbearable and that she is getting clammy and nauseous since yesterday.

## 2023-10-28 NOTE — ED Notes (Signed)
 Pt unable to give urine sample at this time

## 2023-10-28 NOTE — Discharge Instructions (Signed)
 Get help right away if: You have sudden severe pain. Your pain gets steadily worse. You have severe pain along with fever, nausea, vomiting, or excessive sweating. You lose consciousness. These symptoms may represent a serious problem that is an emergency. Do not wait to see if the symptoms will go away. Get medical help right away. Call your local emergency services (911 in the [place] Do not drive yourself to the hospital.

## 2023-10-28 NOTE — ED Notes (Signed)
 Pt is aware that a urine sample is needed at this time, provided a urine cup to collect a sample when the pt can.

## 2023-10-28 NOTE — ED Provider Notes (Signed)
 [place] EMERGENCY DEPARTMENT AT [facility] Provider Note   CSN: [id number] Arrival date & time: 10/28/23  1608     History  Chief Complaint  Patient presents with   Vaginal Bleeding   Abdominal Pain    Jill Vincent is a 28 y.o. female who presents emergency department chief complaint of pelvic pain.  She was seen 2 days ago for pelvic pain at that time she is not having any vaginal bleeding.  She has a history of PCOS and obesity.  She had a CT of the abdomen and pelvis that showed no acute findings.  She is states that because she has "terrible periods" she takes her birth control continuously and usually gives herself a single menstrual cycle every 6 months.  She stopped taking her birth control about 2 weeks ago but started taking it again 2 days ago.  She started bleeding yesterday.  She has been taking tramadol  Motrin  and Tylenol  without relief of her symptoms she has intermittent nausea without vomiting.  She states pain is on the left side more than the right side.   Vaginal Bleeding Associated symptoms: abdominal pain   Abdominal Pain Associated symptoms: vaginal bleeding        Home Medications Prior to Admission medications   Medication Sig Start Date End Date Taking? Authorizing Provider  ALPRAZolam  (XANAX ) 0.5 MG tablet Take one pill per day as needed 04/07/20   [provider]  dicyclomine  (BENTYL ) 20 MG tablet Take 1 tablet (20 mg total) by mouth 2 (two) times daily. Patient not taking: Reported on 06/21/2023 09/20/22   Coretha Dew, PA-C  DULoxetine  (CYMBALTA ) 30 MG capsule Take 1 capsule (30 mg total) by mouth 3 (three) times daily. 06/21/23 09/19/23  Zelma Hidden, FNP  levonorgestrel -ethinyl estradiol (LYBREL) 90-20 MCG tablet Take 1 tablet by mouth daily. 06/21/23   Zelma Hidden, FNP  methylphenidate  (RITALIN  LA) 40 MG 24 hr capsule Take 40 mg by mouth every morning.    [provider]  methylphenidate  (RITALIN ) 10 MG tablet  Take 1 tablet by mouth daily in the afternoons as needed Patient not taking: Reported on 06/21/2023 06/25/20   [provider]  omeprazole  (PRILOSEC) 40 MG capsule TAKE 1 CAPSULE BY MOUTH EVERY DAY 03/23/19   Adela Holter, DO  ondansetron  (ZOFRAN ) 8 MG tablet Take 1 tab(s) orally 3 times a day, as needed 01/20/20   [provider]  ondansetron  (ZOFRAN -ODT) 4 MG disintegrating tablet Take 1 tablet (4 mg total) by mouth every 8 (eight) hours as needed for nausea. 09/20/22   Coretha Dew, PA-C  QVAR  REDIHALER 40 MCG/ACT inhaler TAKE 2 PUFFS BY MOUTH TWICE A DAY 06/03/19   Adela Holter, DO  rizatriptan  (MAXALT ) 10 MG tablet TAKE 1 TABLET BY MOUTH EVERY 2 HOURS AS NEEDED. MAX 2 TABS/DAY 01/08/19   Adela Holter, DO  traMADol  (ULTRAM ) 50 MG tablet Take 1 tablet (50 mg total) by mouth every 6 (six) hours as needed. 06/21/23   Zelma Hidden, FNP  VENTOLIN HFA 108 (90 Base) MCG/ACT inhaler USE 2 PUFFS AS NEEDED EVERY 6 HRS 08/06/18   Adela Holter, DO      Allergies    Barley grass, Gluten meal, Sulfa antibiotics, and Wheat    Review of Systems   Review of Systems  Gastrointestinal:  Positive for abdominal pain.  Genitourinary:  Positive for vaginal bleeding.    Physical Exam Updated Vital Signs BP 127/87   Pulse (!) 104   Temp  98 F (36.7 C) (Oral)   Resp 20   LMP 10/27/2023 (Exact Date)   SpO2 100%  Physical Exam Vitals and nursing note reviewed.  Constitutional:      General: She is not in acute distress.    Appearance: She is well-developed. She is not diaphoretic.  HENT:     Head: Normocephalic and atraumatic.     Right Ear: External ear normal.     Left Ear: External ear normal.     Nose: Nose normal.     Mouth/Throat:     Mouth: Mucous membranes are moist.  Eyes:     General: No scleral icterus.    Conjunctiva/sclera: Conjunctivae normal.  Cardiovascular:     Rate and Rhythm: Normal rate and regular rhythm.     Heart sounds: Normal heart sounds. No  murmur heard.    No friction rub. No gallop.  Pulmonary:     Effort: Pulmonary effort is normal. No respiratory distress.     Breath sounds: Normal breath sounds.  Abdominal:     General: Bowel sounds are normal. There is no distension.     Palpations: Abdomen is soft. There is no mass.     Tenderness: There is abdominal tenderness in the right lower quadrant, suprapubic area and left lower quadrant. There is no guarding.  Musculoskeletal:     Cervical back: Normal range of motion.  Skin:    General: Skin is warm and dry.  Neurological:     Mental Status: She is alert and oriented to person, place, and time.  Psychiatric:        Behavior: Behavior normal.     ED Results / Procedures / Treatments   Labs (all labs ordered are listed, but only abnormal results are displayed) Labs Reviewed  LIPASE, BLOOD  COMPREHENSIVE METABOLIC PANEL WITH GFR  CBC  URINALYSIS, ROUTINE W REFLEX MICROSCOPIC  PREGNANCY, URINE    EKG None  Radiology CT Renal Stone Study Result Date: 10/26/2023 CLINICAL DATA:  Left flank pain. EXAM: CT ABDOMEN AND PELVIS WITHOUT CONTRAST TECHNIQUE: Multidetector CT imaging of the abdomen and pelvis was performed following the standard protocol without IV contrast. RADIATION DOSE REDUCTION: This exam was performed according to the departmental dose-optimization program which includes automated exposure control, adjustment of the mA and/or kV according to patient size and/or use of iterative reconstruction technique. COMPARISON:  February 17, 2021 FINDINGS: Lower chest: No acute abnormality. Hepatobiliary: No focal liver abnormality is seen. No gallstones, gallbladder wall thickening, or biliary dilatation. Pancreas: Unremarkable. No pancreatic ductal dilatation or surrounding inflammatory changes. Spleen: Normal in size without focal abnormality. Adrenals/Urinary Tract: Adrenal glands are unremarkable. Kidneys are normal, without renal calculi, focal lesion, or  hydronephrosis. The urinary bladder is empty and subsequently limited in evaluation. Stomach/Bowel: Stomach is within normal limits. Appendix appears normal. No evidence of bowel wall thickening, distention, or inflammatory changes. Vascular/Lymphatic: No significant vascular findings are present. No enlarged abdominal or pelvic lymph nodes. Reproductive: Uterus and bilateral adnexa are unremarkable. Other: No abdominal wall hernia or abnormality. No abdominopelvic ascites. Musculoskeletal: No acute or significant osseous findings. IMPRESSION: No acute or active process within the abdomen or pelvis. Electronically Signed   By: Virgle Grime M.D.   On: 10/26/2023 20:40    Procedures Procedures    Medications Ordered in ED Medications - No data to display  ED Course/ Medical Decision Making/ A&P  Medical Decision Making Amount and/or Complexity of Data Reviewed Labs: ordered.  Risk Prescription drug management.   Patient evaluated at bedside.  Labs are reassuring.  I visualized and interpreted a pelvic ultrasound which showed some exophytic fibroids no other acute findings.  She did not get a Doppler study of her ovaries and will need this to rule out torsion.  Other issues include dysmenorrhea.  I have low suspicion for colitis or urinary tract infection.  This could also be secondary to the obvious fibroids on her study. At this time plan to get a Doppler study of her ovaries to make sure that there is no evidence of torsion we will give a shot of Toradol  and Zofran    Case discussed with the US  tech and we then had a discussion with the patient at bedside regarding Doppler. According to the [facility], she was unable to visualize the R ovary, The left ovary was difficult to assess secondary to position but had no swelling, no other signs, no swirrling and no other signs of torsion without Doppler.The left is the side that patient states is hurting more.  Furthermore, Patient CT scan showed no abnormalities in the Adnexa BL on CT imaging 2 days ago.   I discussed all options with the patient including further doppler US , however after discussion, patient declines further work up with Doppler US  and understands that its possible but unlikely she could have torsion.  Patient also crying stating: " Im just frustrated that this hurts so much. Something has to be wrong for it to hurt this much." Patient also declines any STI testing and states :" I just want to go home."   Labs and imaging ordered at triage are reassuring. WIll dc with pain meds and gyn follow up. PDMP reviewed during this encounter.         Final Clinical Impression(s) / ED Diagnoses Final diagnoses:  None    Rx / DC Orders ED Discharge Orders     None         Tama Fails, PA-C 10/31/23 1522    Rolinda Climes, DO 10/31/23 1558

## 2023-11-11 ENCOUNTER — Ambulatory Visit (INDEPENDENT_AMBULATORY_CARE_PROVIDER_SITE_OTHER): Admitting: Obstetrics and Gynecology

## 2023-11-11 ENCOUNTER — Encounter: Payer: Self-pay | Admitting: Obstetrics and Gynecology

## 2023-11-11 ENCOUNTER — Other Ambulatory Visit (HOSPITAL_COMMUNITY)
Admission: RE | Admit: 2023-11-11 | Discharge: 2023-11-11 | Disposition: A | Discharge status: Home / Self Care | Source: Ambulatory Visit | Attending: Obstetrics and Gynecology | Admitting: Obstetrics and Gynecology

## 2023-11-11 VITALS — BP 113/80 | HR 92 | Ht 64.0 in | Wt 224.0 lb

## 2023-11-11 DIAGNOSIS — R102 Pelvic and perineal pain: Secondary | ICD-10-CM | POA: Diagnosis present

## 2023-11-11 MED ORDER — OXYCODONE HCL 5 MG PO TABS: 2.5000 mg | ORAL_TABLET | ORAL | 0 refills | Status: AC | PRN

## 2023-11-11 NOTE — Progress Notes (Signed)
 Pt complains of pelvic pain, with or without cycle. Pt feels bloated, pain is constant at a 4 level but will intensify to 8-10.  Pt takes continuous BC pill.  Pt uses Tramadol , Tylenol  and Ibuprofen  - lately with little relief.   Pt recently seen in ED and had u/s.  Pt denies urinary or bowel problems.

## 2023-11-11 NOTE — Progress Notes (Signed)
 Cc: pelvic pain Subjective:    Patient ID: Jill Vincent, female    DOB: 06-29-1995, 28 y.o.   MRN: [id number]  HPI 28 yo G0 seen for discussion of pelvic pain.  Pt has a history of PCOS, celiac disease.  Recently she has had issues with pelvic pain and bloating.  Recent CT scan and pelvic ultrasound were normal.  Pt notes she has not been seen by GI recently.  She has been taking continuous OCPs for prevention of ovarian cysts. The pain is sharp and burning.  It is mostly continuous and centers in the LLQ.  Pain many times is worse at the end of the day.  NSAIDs have been largely ineffective.  Narcotics (oxycodone ) will briefly diminish pain.     Review of Systems     Objective:    Physical Exam Constitutional:      General: She is not in acute distress.    Appearance: Normal appearance. She is obese. She is not ill-appearing.  HENT:     Head: Normocephalic and atraumatic.  Cardiovascular:     Rate and Rhythm: Normal rate and regular rhythm.     Heart sounds: Normal heart sounds.  Pulmonary:     Effort: Pulmonary effort is normal.     Breath sounds: Normal breath sounds.  Abdominal:     General: Abdomen is flat. There is no distension.     Palpations: Abdomen is soft. There is no mass.     Hernia: No hernia is present.     Comments: Mild-moderate diffuse lower abdominal pain with palpation.  Genitourinary:    Comments: Vaginal swab taken Normal vaginal mucosa Cvx WNL No CMT Anterior vagina was tender Bilateral adnexa tender L>R   Vitals:   11/11/23 0906  BP: 113/80  Pulse: 92   CLINICAL DATA:  Left flank pain.   EXAM: CT ABDOMEN AND PELVIS WITHOUT CONTRAST   TECHNIQUE: Multidetector CT imaging of the abdomen and pelvis was performed following the standard protocol without IV contrast.   RADIATION DOSE REDUCTION: This exam was performed according to the departmental dose-optimization program which includes automated exposure control, adjustment of the mA  and/or kV according to patient size and/or use of iterative reconstruction technique.   COMPARISON:  February 17, 2021   FINDINGS: Lower chest: No acute abnormality.   Hepatobiliary: No focal liver abnormality is seen. No gallstones, gallbladder wall thickening, or biliary dilatation.   Pancreas: Unremarkable. No pancreatic ductal dilatation or surrounding inflammatory changes.   Spleen: Normal in size without focal abnormality.   Adrenals/Urinary Tract: Adrenal glands are unremarkable. Kidneys are normal, without renal calculi, focal lesion, or hydronephrosis. The urinary bladder is empty and subsequently limited in evaluation.   Stomach/Bowel: Stomach is within normal limits. Appendix appears normal. No evidence of bowel wall thickening, distention, or inflammatory changes.   Vascular/Lymphatic: No significant vascular findings are present. No enlarged abdominal or pelvic lymph nodes.   Reproductive: Uterus and bilateral adnexa are unremarkable.   Other: No abdominal wall hernia or abnormality. No abdominopelvic ascites.   Musculoskeletal: No acute or significant osseous findings.   IMPRESSION: No acute or active process within the abdomen or pelvis.   CLINICAL DATA:  Generalized pelvic pain, vaginal bleeding   EXAM: TRANSABDOMINAL AND TRANSVAGINAL ULTRASOUND OF PELVIS   TECHNIQUE: Both transabdominal and transvaginal ultrasound examinations of the pelvis were performed. Transabdominal technique was performed for global imaging of the pelvis including uterus, ovaries, adnexal regions, and pelvic cul-de-sac. It was necessary to proceed with endovaginal  exam following the transabdominal exam to visualize the uterus, endometrium, ovaries and adnexa.   COMPARISON:  CT 10/26/2023   FINDINGS: Uterus   Measurements: 6.4 x 2.7 x 3.4 cm. = volume: 31 mL. Small fibroids, the largest an exophytic subserosal anterior fundal fibroid measuring 1.9 cm.   Endometrium    Thickness: Normal thickness, 3 mm.  No focal abnormality visualized.   Right ovary   Measurements: Not visualized.  No adnexal mass seen.   Left ovary   Measurements: 1.8 x 1.2 x 1.4 cm = volume: 1.7 mL. Normal appearance/no adnexal mass.   Other findings   No abnormal free fluid.   IMPRESSION: Small uterine fibroids.   No acute findings.          Assessment & Plan:   1. Pelvic pain in female (Primary) Small rx of oxycodone  [place] Pt is aware no further refills unless seen for consultation. Due to the bloating and GI history, pt was advised to be evaluated with gastroenterology first.   If nothing is found from GI can consider diagnostic laparoscopy to eval for endometriosis.  F/u after GI eval as needed - Cervicovaginal ancillary only( [place]) - oxyCODONE  (ROXICODONE ) 5 MG immediate release tablet; Take 0.5-1 tablets (2.5-5 mg total) by mouth every 4 (four) hours as needed for severe pain (pain score 7-10).  Dispense: 12 tablet; Refill: 0    Jill Abler, MD Faculty Attending, [facility]

## 2023-11-14 LAB — CERVICOVAGINAL ANCILLARY ONLY
Bacterial Vaginitis (gardnerella): NEGATIVE
Candida Glabrata: NEGATIVE
Candida Vaginitis: NEGATIVE
Chlamydia: NEGATIVE
Comment: NEGATIVE
Comment: NEGATIVE
Comment: NEGATIVE
Comment: NEGATIVE
Comment: NEGATIVE
Comment: NORMAL
Neisseria Gonorrhea: NEGATIVE
Trichomonas: NEGATIVE

## 2023-11-15 ENCOUNTER — Ambulatory Visit: Payer: Self-pay | Admitting: Obstetrics and Gynecology

## 2023-12-05 ENCOUNTER — Other Ambulatory Visit: Payer: Self-pay
